# Patient Record
Sex: Male | Born: 2000 | Race: Asian | Hispanic: No | Marital: Single | Smoking: Never smoker
Health system: Southern US, Community
[De-identification: ages and names within clinical notes are randomized; demographics above are authoritative.]

## PROBLEM LIST (undated history)

## (undated) DIAGNOSIS — Q909 Down syndrome, unspecified: Secondary | ICD-10-CM

## (undated) HISTORY — DX: Down syndrome, unspecified: Q90.9

---
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
## 2018-01-28 ENCOUNTER — Ambulatory Visit (INDEPENDENT_AMBULATORY_CARE_PROVIDER_SITE_OTHER): Payer: Medicaid Other | Admitting: Family Medicine

## 2018-01-28 ENCOUNTER — Encounter: Payer: Self-pay | Admitting: Family Medicine

## 2018-01-28 ENCOUNTER — Other Ambulatory Visit: Payer: Self-pay

## 2018-01-28 VITALS — BP 95/60 | HR 83 | Temp 96.5°F | Wt 99.0 lb

## 2018-01-28 DIAGNOSIS — Q909 Down syndrome, unspecified: Secondary | ICD-10-CM

## 2018-01-28 DIAGNOSIS — L732 Hidradenitis suppurativa: Secondary | ICD-10-CM | POA: Diagnosis not present

## 2018-01-28 DIAGNOSIS — R5383 Other fatigue: Secondary | ICD-10-CM | POA: Diagnosis not present

## 2018-01-28 MED ORDER — DOXYCYCLINE HYCLATE 100 MG PO TABS
100.0000 mg | ORAL_TABLET | Freq: Two times a day (BID) | ORAL | 2 refills | Status: DC
Start: 2018-01-28 — End: 2020-02-09

## 2018-01-28 MED ORDER — OMEPRAZOLE 40 MG PO CPDR: 40.0000 mg | DELAYED_RELEASE_CAPSULE | Freq: Every day | ORAL | 3 refills | Status: DC

## 2018-01-28 MED ORDER — IBUPROFEN 600 MG PO TABS: 600.0000 mg | ORAL_TABLET | Freq: Three times a day (TID) | ORAL | 0 refills | Status: DC | PRN

## 2018-01-28 NOTE — Assessment & Plan Note (Addendum)
Active infections in inner thighs.  Patient father does not want to pursue I&D today.  Will trial doxycycline as this is work for the patient in the past.  Recommend warm compresses to encourage drainage.  Given limited capacity, recommend home health assessment for dressing changes and observation of wounds as needed.  Also plan to refer to dermatology for further management of disease, father interested in possible Biologics therapy. Patient to follow-up in 2 weeks to reassess, ibuprofen PRN for pain.  Strict return precautions given.

## 2018-01-28 NOTE — Assessment & Plan Note (Signed)
Patient not connected with community resources.  List of resources given.  Home health orders put in to help family with burden of care with son.  Also place referral to psychiatry for further management of ongoing symptoms and complications of Down syndrome.

## 2018-01-28 NOTE — Assessment & Plan Note (Addendum)
New onset fatigue.  Difficult to determine extent given patient's mental disability and nonverbal.  Given history of hidradenitis of vertebral with active inner thigh abscesses, concern for possible worsening systemic infection.  Vital signs are reassuring however.  Other causes could be uncontrolled pain, endocrine dysfunction, electrolyte abnormalities, anemia.  Will check for CMP, TSH, CBC to assess for this.  Given subjective improvement with Tums, will trial PPI if this is untreated GERD.  Patient follow-up in 2 weeks.

## 2018-01-28 NOTE — Progress Notes (Signed)
Subjective:  Brandon Bennett is a 17 y.o. male who presents to the [facility] today with a chief complaint of increase fatigue.   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 History provided by Father.   Fatigue For past two weeks, pt has seemed progressively less energetic, especially after meals, pt is also rubbing his stomach as if it is painful after eating. The only thing that seems to help is tums after meals. He has been eating and drinking normally. He has not had any constitutional symptoms that the father knows of including fevers, chills, weightloss. No respiratory sypmtoms or cold or cough. Pt has hydradinitis supertiva with recurrent abscesses. Father provides all of the wound care at home. Pt has had two draining and painful abscesses, one on each of his thighs.   Hydratinitis Supertivea Patient has history of recurrent inguinal abscesses.  Has been evaluated by dermatology and plastic surgery in the past [facility], not considered candidate for surgery given the extent of the disease and need for aggressive wound therapy in association with Ms. Down syndrome per father.    Down's Syndrome Primarily taken care of by father mother.  Patient is not connected with outside community resources or family support.  Or mental health officials.  Father is currently working out of state and is having difficulty managing the complexity of his son's health care.    Objective:  Physical Exam: BP 95/60   Pulse 83   Temp (!) 96.5 F (35.8 C) (Oral)   Wt 99 lb (44.9 kg)   SpO2 98%   BMI 19.33 kg/m   Gen: NAD, resting comfortably CV: RRR with no murmurs appreciated Pulm: NWOB, CTAB with no crackles, wheezes, or rhonchi GI: Normal bowel sounds present. Soft, Nontender, Nondistended. MSK: no edema, cyanosis, or clubbing noted Skin: multiple comeodone nodules on face and back, actively draining, small abscess on left inner thigh, another infected abcess on  right thigh which in not actively draining, they are tender to palpation, no streaking of skin or surround redness Neuro: nonverbal, obeys commands  No results found for this or any previous visit (from the past 72 hour(s)).   Assessment/Plan:  Fatigue New onset fatigue.  Difficult to determine extent given patient's mental disability and nonverbal.  Given history of hidradenitis of vertebral with active inner thigh abscesses, concern for possible worsening systemic infection.  Vital signs are reassuring however.  Other causes could be uncontrolled pain, endocrine dysfunction, electrolyte abnormalities, anemia.  Will check for CMP, TSH, CBC to assess for this.  Given subjective improvement with Tums, will trial PPI if this is untreated GERD.  Patient follow-up in 2 weeks.  Hidradenitis suppurativa Active infections in inner thighs.  Patient father does not want to pursue I&D today.  Will trial doxycycline as this is work for the patient in the past.  Recommend warm compresses to encourage drainage.  Given limited capacity, recommend home health assessment for dressing changes and observation of wounds as needed.  Also plan to refer to dermatology for further management of disease, father interested in possible Biologics therapy. Patient to follow-up in 2 weeks to reassess, ibuprofen PRN for pain.  Strict return precautions given.  DOWN SYNDROME Patient not connected with community resources.  List of resources given.  Home health orders put in to help family with burden of care with son.  Also place referral to psychiatry for further management of ongoing symptoms and complications of Down syndrome.  Lab Orders     TSH     Comprehensive metabolic panel     CBC with Differential  Meds ordered this encounter  Medications  . doxycycline (VIBRA-TABS) 100 MG tablet    Sig: Take 1 tablet (100 mg total) by mouth 2 (two) times daily.    Dispense:  20 tablet    Refill:  2  . omeprazole  (PRILOSEC) 40 MG capsule    Sig: Take 1 capsule (40 mg total) by mouth daily.    Dispense:  30 capsule    Refill:  3  . ibuprofen (ADVIL,MOTRIN) 600 MG tablet    Sig: Take 1 tablet (600 mg total) by mouth every 8 (eight) hours as needed.    Dispense:  30 tablet    Refill:  0    Thomes Dinning, MD, MS FAMILY MEDICINE RESIDENT - PGY1 01/28/2018 5:02 PM

## 2018-01-28 NOTE — Patient Instructions (Addendum)
It was a pleasure to see you today! Thank you for choosing [facility] for your primary care. Brandon Bennett was seen for fatigue.   1. For fatigue, we are getting lab work to rule out any infection or endocrine issues.   2. For his abscesses, we are starting him on doxycyline. Come back in two weeks, unless it is getting worse with fever, drainage. Continue to apply warm compresses. He can ibuprofen as needed for pain or discomfort. I am also placing an order for home health services.   3. For the Hydradenitis suppurativa, I am placing a referral to Dermatology. You will hear from our office on your follow up appointment.   4. For his Down Syndrome, we are providing you a list of community resources and I am referring you to Psychiatry for further support of his condition.   Best,  Thomes DinningBrad Thompson, MD, MS FAMILY MEDICINE RESIDENT - PGY1 01/28/2018 3:05 PM

## 2018-01-29 LAB — CBC WITH DIFFERENTIAL/PLATELET
BASOS ABS: 0.1 10*3/uL (ref 0.0–0.2)
Basos: 3 %
EOS (ABSOLUTE): 0.1 10*3/uL (ref 0.0–0.4)
EOS: 2 %
Hematocrit: 43.1 % (ref 37.5–51.0)
Hemoglobin: 14.4 g/dL (ref 13.0–17.7)
IMMATURE GRANULOCYTES: 0 %
Immature Grans (Abs): 0 10*3/uL (ref 0.0–0.1)
LYMPHS ABS: 1.6 10*3/uL (ref 0.7–3.1)
Lymphs: 40 %
MCH: 34.6 pg — ABNORMAL HIGH (ref 26.6–33.0)
MCHC: 33.4 g/dL (ref 31.5–35.7)
MCV: 104 fL — ABNORMAL HIGH (ref 79–97)
MONOCYTES: 8 %
MONOS ABS: 0.3 10*3/uL (ref 0.1–0.9)
NEUTROS PCT: 47 %
Neutrophils Absolute: 1.8 10*3/uL (ref 1.4–7.0)
PLATELETS: 109 10*3/uL — AB (ref 150–450)
RBC: 4.16 x10E6/uL (ref 4.14–5.80)
RDW: 11.1 % — AB (ref 12.3–15.4)
WBC: 3.9 10*3/uL (ref 3.4–10.8)

## 2018-01-29 LAB — TSH: TSH: 1.24 u[IU]/mL (ref 0.450–4.500)

## 2018-01-29 LAB — COMPREHENSIVE METABOLIC PANEL
ALBUMIN: 4 g/dL (ref 3.5–5.5)
ALT: 38 IU/L (ref 0–44)
AST: 32 IU/L (ref 0–40)
Albumin/Globulin Ratio: 1.1 — ABNORMAL LOW (ref 1.2–2.2)
Alkaline Phosphatase: 113 IU/L (ref 39–117)
BUN / CREAT RATIO: 14 (ref 9–20)
BUN: 11 mg/dL (ref 6–20)
Bilirubin Total: 0.7 mg/dL (ref 0.0–1.2)
CALCIUM: 9 mg/dL (ref 8.7–10.2)
CHLORIDE: 101 mmol/L (ref 96–106)
CO2: 27 mmol/L (ref 20–29)
CREATININE: 0.76 mg/dL (ref 0.76–1.27)
GFR calc non Af Amer: 132 mL/min/{1.73_m2} (ref >59)
GFR, EST AFRICAN AMERICAN: 153 mL/min/{1.73_m2} (ref >59)
GLUCOSE: 79 mg/dL (ref 65–99)
Globulin, Total: 3.5 g/dL (ref 1.5–4.5)
Potassium: 4.3 mmol/L (ref 3.5–5.2)
Sodium: 139 mmol/L (ref 134–144)
TOTAL PROTEIN: 7.5 g/dL (ref 6.0–8.5)

## 2018-02-01 ENCOUNTER — Telehealth: Payer: Self-pay | Admitting: Family Medicine

## 2018-02-01 ENCOUNTER — Encounter: Payer: Self-pay | Admitting: Family Medicine

## 2018-02-01 MED ORDER — OMEPRAZOLE 20 MG PO CPDR: 20.0000 mg | DELAYED_RELEASE_CAPSULE | Freq: Two times a day (BID) | ORAL | 3 refills | Status: DC | PRN

## 2018-02-01 NOTE — Progress Notes (Signed)
Please send to pt . Thank you

## 2018-02-01 NOTE — Telephone Encounter (Signed)
Pts father called nurse line asking about spilling the omeprazole dose. Pts father states he thinks its too high for him and would like to give half in am and half pm. Please call him at your earliest convience. Pts father seems quite worried about this.

## 2018-02-01 NOTE — Telephone Encounter (Signed)
Father has a question about omeprazole.  He wonders if he could give him a half a pill in the morning and one at night. Please advise

## 2018-02-01 NOTE — Telephone Encounter (Signed)
Changed dosage to 20 mg bid. Pls call pt and inform.

## 2018-02-02 NOTE — Telephone Encounter (Signed)
Pts father contacted and informed of dosage change. He was very appreciative and had no further questions.

## 2018-02-16 ENCOUNTER — Telehealth: Payer: Self-pay | Admitting: Family Medicine

## 2018-02-16 NOTE — Telephone Encounter (Signed)
Called pt father to follow up on skin infection and referrals placed during last apt. No answer left VM to call clinic back regarding these topics.

## 2018-02-17 NOTE — Telephone Encounter (Signed)
Will forward to MD. Jazmin Hartsell,CMA  

## 2018-02-17 NOTE — Telephone Encounter (Signed)
Pt father was calling  to return Dr. Carollee Massedhompson's phone call from yesterday. He would like Dr. Janee Mornhompson to call him back.

## 2018-02-18 ENCOUNTER — Telehealth: Payer: Self-pay | Admitting: Family Medicine

## 2018-02-18 NOTE — Telephone Encounter (Signed)
Called pt yesterday evening. Father answered and said that son was doing better. He has a schedule apt with dermatology in a few weeks and is trying to get connected with other services. Plans to have pt return in a few weeks to follow up for general health.

## 2019-07-16 ENCOUNTER — Other Ambulatory Visit: Payer: Self-pay

## 2019-07-16 ENCOUNTER — Emergency Department (HOSPITAL_COMMUNITY)
Admission: EM | Admit: 2019-07-16 | Discharge: 2019-07-17 | Disposition: A | Discharge status: Home / Self Care | Payer: No Typology Code available for payment source | Attending: Emergency Medicine | Admitting: Emergency Medicine

## 2019-07-16 ENCOUNTER — Emergency Department (HOSPITAL_COMMUNITY): Payer: No Typology Code available for payment source

## 2019-07-16 DIAGNOSIS — Q909 Down syndrome, unspecified: Secondary | ICD-10-CM | POA: Diagnosis not present

## 2019-07-16 DIAGNOSIS — R4182 Altered mental status, unspecified: Secondary | ICD-10-CM | POA: Diagnosis present

## 2019-07-16 DIAGNOSIS — U071 COVID-19: Secondary | ICD-10-CM | POA: Diagnosis not present

## 2019-07-16 DIAGNOSIS — Z79899 Other long term (current) drug therapy: Secondary | ICD-10-CM | POA: Insufficient documentation

## 2019-07-16 LAB — CBC WITH DIFFERENTIAL/PLATELET
Abs Immature Granulocytes: 0.01 10*3/uL (ref 0.00–0.07)
Basophils Absolute: 0 10*3/uL (ref 0.0–0.1)
Basophils Relative: 0 %
Eosinophils Absolute: 0 10*3/uL (ref 0.0–0.5)
Eosinophils Relative: 0 %
HCT: 41.6 % (ref 39.0–52.0)
Hemoglobin: 14 g/dL (ref 13.0–17.0)
Immature Granulocytes: 0 %
Lymphocytes Relative: 65 %
Lymphs Abs: 2.1 10*3/uL (ref 0.7–4.0)
MCH: 35.1 pg — ABNORMAL HIGH (ref 26.0–34.0)
MCHC: 33.7 g/dL (ref 30.0–36.0)
MCV: 104.3 fL — ABNORMAL HIGH (ref 80.0–100.0)
Monocytes Absolute: 0.2 10*3/uL (ref 0.1–1.0)
Monocytes Relative: 5 %
Neutro Abs: 1 10*3/uL — ABNORMAL LOW (ref 1.7–7.7)
Neutrophils Relative %: 30 %
Platelets: 81 10*3/uL — ABNORMAL LOW (ref 150–400)
RBC: 3.99 MIL/uL — ABNORMAL LOW (ref 4.22–5.81)
RDW: 11.5 % (ref 11.5–15.5)
WBC: 3.2 10*3/uL — ABNORMAL LOW (ref 4.0–10.5)
nRBC: 0.6 % — ABNORMAL HIGH (ref 0.0–0.2)

## 2019-07-16 LAB — COMPREHENSIVE METABOLIC PANEL
ALT: 73 U/L — ABNORMAL HIGH (ref 0–44)
AST: 105 U/L — ABNORMAL HIGH (ref 15–41)
Albumin: 3 g/dL — ABNORMAL LOW (ref 3.5–5.0)
Alkaline Phosphatase: 95 U/L (ref 38–126)
Anion gap: 9 (ref 5–15)
BUN: 9 mg/dL (ref 6–20)
CO2: 26 mmol/L (ref 22–32)
Calcium: 7.8 mg/dL — ABNORMAL LOW (ref 8.9–10.3)
Chloride: 104 mmol/L (ref 98–111)
Creatinine, Ser: 0.8 mg/dL (ref 0.61–1.24)
GFR calc Af Amer: >60 mL/min (ref >60)
GFR calc non Af Amer: >60 mL/min (ref >60)
Glucose, Bld: 87 mg/dL (ref 70–99)
Potassium: 4.4 mmol/L (ref 3.5–5.1)
Sodium: 139 mmol/L (ref 135–145)
Total Bilirubin: 0.6 mg/dL (ref 0.3–1.2)
Total Protein: 6.9 g/dL (ref 6.5–8.1)

## 2019-07-16 LAB — LACTIC ACID, PLASMA: Lactic Acid, Venous: 1.5 mmol/L (ref 0.5–1.9)

## 2019-07-16 LAB — POC SARS CORONAVIRUS 2 AG -  ED: SARS Coronavirus 2 Ag: POSITIVE — AB

## 2019-07-16 LAB — LIPASE, BLOOD: Lipase: 31 U/L (ref 11–51)

## 2019-07-16 MED ORDER — SODIUM CHLORIDE 0.9 % IV BOLUS
1000.0000 mL | Freq: Once | INTRAVENOUS | Status: AC
  Administered 2019-07-16: 1000 mL via INTRAVENOUS

## 2019-07-16 MED ORDER — SODIUM CHLORIDE 0.9 % IV BOLUS
500.0000 mL | Freq: Once | INTRAVENOUS | Status: AC
  Administered 2019-07-16: 500 mL via INTRAVENOUS

## 2019-07-16 NOTE — ED Triage Notes (Signed)
Received patient via [facility], reportedly more altered per family. Has hx of down syndrome and baseline is communicative to family but right now slightly lethargic. Reportedly had an episode of syncope at home, per hypotensive en route to the hospital. Family at bedside.

## 2019-07-16 NOTE — ED Provider Notes (Signed)
[facility] EMERGENCY DEPARTMENT Provider Note   CSN: [id number] Arrival date & time: 07/16/19  1909     History Chief Complaint  Patient presents with  . Altered Mental Status    Brandon Bennett is a 19 y.o. male with history of Down syndrome and hidradenitis suppurativa.  Patient is here with his father who is primary caretaker.  Patient nonverbal at baseline but is able to express needs through body language.  Normally interactive with family, eats, helps bathe himself and ambulates around the house.  Father reports that over the last 2-3 days patient has seemed more tired, not wanting to eat and drink or perform normal activities.  Father reports that 2 days ago he was walking patient to the bathroom when the patient had a brief syncopal episode, no injury at that time.  Has not reoccurred.  Father reports that since onset of symptoms 3 days ago patient has had multiple episodes of diarrhea and single episode of nonbloody/nonbilious emesis.  Father reports they have measured fevers intermittently at home.  He is concerned patient may be dehydrated.   HPI     Past Medical History:  Diagnosis Date  . Down syndrome     Patient Active Problem List   Diagnosis Date Noted  . Fatigue 01/28/2018  . Infective otitis externa of both ears 10/30/2017  . Health care maintenance 05/12/2017  . Recurrent boils 05/29/2014  . Hidradenitis suppurativa 06/16/2013  . DOWN SYNDROME 09/08/2007    No past surgical history on file.     Family History  Problem Relation Age of Onset  . Healthy Mother     Social History   Tobacco Use  . Smoking status: Never Smoker  . Smokeless tobacco: Never Used  Substance Use Topics  . Alcohol use: No  . Drug use: No    Home Medications Prior to Admission medications   Medication Sig Start Date End Date Taking? Authorizing Provider  Cetirizine HCl (CETIRIZINE HCL ALLERGY CHILD) 5 MG/5ML SYRP Take 5 mg by mouth daily. Please give  him what Medicaid will pay for OTC is fine.  Should not need PA.  Disp 1 month supply.     [provider]  ciprofloxacin-dexamethasone (CIPRODEX) OTIC suspension Place 4 drops into both ears 2 (two) times daily. 10/28/17   Mikell, Antionette Poles, MD  clindamycin (CLINDAGEL) 1 % gel Apply topically 2 (two) times daily. 03/09/17   Diallo, Lilia Argue, MD  clindamycin-benzoyl peroxide (BENZACLIN WITH PUMP) gel Apply topically 2 (two) times daily. 05/07/17   Beaulah Dinning, MD  doxycycline (VIBRA-TABS) 100 MG tablet Take 1 tablet (100 mg total) by mouth 2 (two) times daily. 01/28/18   Garnette Gunner, MD  fluticasone (FLONASE) 50 MCG/ACT nasal spray Place 1 spray into both nostrils daily. 05/07/17   Beaulah Dinning, MD  ibuprofen (ADVIL,MOTRIN) 600 MG tablet Take 1 tablet (600 mg total) by mouth every 8 (eight) hours as needed. 01/28/18   Garnette Gunner, MD  omeprazole (PRILOSEC) 20 MG capsule Take 1 capsule (20 mg total) by mouth 2 (two) times daily as needed. 02/01/18   Garnette Gunner, MD  traMADol (ULTRAM) 50 MG tablet Take 1 tablet (50 mg total) by mouth every 8 (eight) hours as needed. 07/10/16   Beaulah Dinning, MD    Allergies    Patient has no known allergies.  Review of Systems   Review of Systems  Unable to perform ROS: Patient nonverbal    Physical Exam Updated  Vital Signs BP 100/71   Pulse 63   Temp 98.5 F (36.9 C) (Oral)   Resp 18   Ht 5\' 5"  (1.651 m)   Wt 45.4 kg   SpO2 100%   BMI 16.64 kg/m   Physical Exam Constitutional:      General: He is not in acute distress.    Appearance: Normal appearance. He is well-developed. He is not ill-appearing or diaphoretic.  HENT:     Head: Normocephalic and atraumatic.     Jaw: There is normal jaw occlusion.     Right Ear: External ear normal.     Left Ear: External ear normal.     Nose: Nose normal.     Mouth/Throat:     Mouth: Mucous membranes are moist.     Pharynx: Oropharynx is clear. Uvula  midline.  Eyes:     General: Vision grossly intact. Gaze aligned appropriately.     Pupils: Pupils are equal, round, and reactive to light.  Neck:     Trachea: Trachea and phonation normal. No tracheal tenderness or tracheal deviation.  Cardiovascular:     Rate and Rhythm: Normal rate and regular rhythm.     Pulses: Normal pulses.     Heart sounds: Normal heart sounds.  Pulmonary:     Effort: Pulmonary effort is normal. No respiratory distress.     Breath sounds: Normal breath sounds.  Abdominal:     General: There is no distension.     Palpations: Abdomen is soft.     Tenderness: There is no abdominal tenderness. There is no guarding or rebound.  Musculoskeletal:        General: Normal range of motion.     Cervical back: Full passive range of motion without pain, normal range of motion and neck supple.     Right lower leg: No edema.     Left lower leg: No edema.  Lymphadenopathy:     Cervical: No cervical adenopathy.  Skin:    General: Skin is warm and dry.  Neurological:     Mental Status: He is alert.     GCS: GCS eye subscore is 4. GCS verbal subscore is 5. GCS motor subscore is 6.     Comments: Nonverbal, baseline per father, smiling No cranial nerve deficits Moves bilateral extremities without evidence of pain and with equal strength  Psychiatric:        Behavior: Behavior normal.     ED Results / Procedures / Treatments   Labs (all labs ordered are listed, but only abnormal results are displayed) Labs Reviewed  CBC WITH DIFFERENTIAL/PLATELET - Abnormal; Notable for the following components:      Result Value   WBC 3.2 (*)    RBC 3.99 (*)    MCV 104.3 (*)    MCH 35.1 (*)    Platelets 81 (*)    nRBC 0.6 (*)    Neutro Abs 1.0 (*)    All other components within normal limits  COMPREHENSIVE METABOLIC PANEL - Abnormal; Notable for the following components:   Calcium 7.8 (*)    Albumin 3.0 (*)    AST 105 (*)    ALT 73 (*)    All other components within normal  limits  URINALYSIS, ROUTINE W REFLEX MICROSCOPIC - Abnormal; Notable for the following components:   Color, Urine STRAW (*)    Hgb urine dipstick SMALL (*)    All other components within normal limits  POC SARS CORONAVIRUS 2 AG -  ED -  Abnormal; Notable for the following components:   SARS Coronavirus 2 Ag POSITIVE (*)    All other components within normal limits  CULTURE, BLOOD (ROUTINE X 2)  CULTURE, BLOOD (ROUTINE X 2)  LIPASE, BLOOD  LACTIC ACID, PLASMA  LACTIC ACID, PLASMA    EKG EKG Interpretation  Date/Time:  Sunday July 16 2019 19:58:59 EST Ventricular Rate:  69 PR Interval:    QRS Duration: 82 QT Interval:  403 QTC Calculation: 432 R Axis:   80 Text Interpretation: Sinus rhythm Short PR interval When compared with ECG of 21-Feb-1999, QRS axis is now normal Nonspecific T wave abnormality is no longer present HEART RATE has decreased Confirmed by Dione Booze ([id number]) on 07/17/2019 12:09:16 AM   Radiology DG Chest Portable 1 View  Result Date: 07/16/2019 CLINICAL DATA:  Fever, hypotension EXAM: PORTABLE CHEST 1 VIEW COMPARISON:  06/13/2016 FINDINGS: The heart size and mediastinal contours are within normal limits. Both lungs are clear. The visualized skeletal structures are unremarkable. IMPRESSION: No acute abnormality of the lungs in AP portable projection. Electronically Signed   By: Lauralyn Primes M.D.   On: 07/16/2019 20:13    Procedures Procedures (including critical care time)  Medications Ordered in ED Medications  sodium chloride 0.9 % bolus 1,000 mL (0 mLs Intravenous Stopped 07/16/19 2140)  sodium chloride 0.9 % bolus 500 mL (0 mLs Intravenous Stopped 07/16/19 2334)  sodium chloride 0.9 % bolus 1,000 mL (1,000 mLs Intravenous New Bag/Given 07/16/19 2343)    ED Course  I have reviewed the triage vital signs and the nursing notes.  Pertinent labs & imaging results that were available during my care of the patient were reviewed by me and considered in my medical  decision making (see chart for details).    MDM Rules/Calculators/A&P                     Covid positive Lipase within normal limits CBC with leukopenia 29.1-year-old, platelets 81, consistent with Covid CMP with AST 105, ALT 73, suspect secondary to Covid, no abdominal pain and family reports he has not vomited since yesterday which was only small amount, additionally no history of Tylenol abuse.  Doubt cholecystitis, choledocholithiasis or other emergent intra-abdominal etiologies at this time. Lactic 1.5 Chest x-ray:  IMPRESSION:  No acute abnormality of the lungs in AP portable projection.  Urinalysis with small hemoglobin, straw-colored otherwise within normal limits Blood cultures pending EKG: Sinus rhythm Short PR interval When compared with ECG of August 09, 1998, QRS axis is now normal Nonspecific T wave abnormality is no longer present HEART RATE has decreased Confirmed by Dione Booze ([id number]) on 07/17/2019 12:09:16 AM - Patient reassessed multiple times each time he is comfortable, well-appearing, smiling and interacting with family members.  Blood pressure has remained soft, low 100 systolic.  Chart reviewed, it appears this is patient's baseline blood pressure.  He is a thin patient.  He is mentating at baseline per family.  I had discussion with patient's family member Brandon Bennett who advises they are comfortable with caring for patient at home.  He has had no tachycardia or hypoxia throughout this visit.  Lab abnormalities are indicative of his Covid infection, there is no indication for admission or further work-up here in the emergency department.  I have advised family to call patient's primary care provider tomorrow morning to inform them of his Covid positive status and to return to the ER for any new or worsening symptoms.  At this time there does not appear  to be any evidence of an acute emergency medical condition and the patient appears stable for discharge with appropriate outpatient  follow up. Diagnosis was discussed with family who verbalizes understanding of care plan and is agreeable to discharge. I have discussed return precautions with patient and family who verbalizes understanding of return precautions. Family encouraged to follow-up with their PCP. All questions answered. Patient has been discharged in good condition.  Patient's case discussed with Dr. Johnney Killian who agrees with plan to discharge with follow-up.   Brandon Bennett was evaluated in Emergency Department on 07/17/2019 for the symptoms described in the history of present illness. He was evaluated in the context of the global COVID-19 pandemic, which necessitated consideration that the patient might be at risk for infection with the SARS-CoV-2 virus that causes COVID-19. Institutional protocols and algorithms that pertain to the evaluation of patients at risk for COVID-19 are in a state of rapid change based on information released by regulatory bodies including the CDC and federal and state organizations. These policies and algorithms were followed during the patient's care in the ED.   Note: Portions of this report may have been transcribed using voice recognition software. Every effort was made to ensure accuracy; however, inadvertent computerized transcription errors may still be present.  Final Clinical Impression(s) / ED Diagnoses Final diagnoses:  COVID-19 virus infection    Rx / DC Orders ED Discharge Orders    None       Gari Crown 07/17/19 0032    Charlesetta Shanks, MD 07/24/19 1122

## 2019-07-17 LAB — URINALYSIS, ROUTINE W REFLEX MICROSCOPIC
Bacteria, UA: NONE SEEN
Bilirubin Urine: NEGATIVE
Glucose, UA: NEGATIVE mg/dL
Ketones, ur: NEGATIVE mg/dL
Leukocytes,Ua: NEGATIVE
Nitrite: NEGATIVE
Protein, ur: NEGATIVE mg/dL
Specific Gravity, Urine: 1.005 (ref 1.005–1.030)
pH: 7 (ref 5.0–8.0)

## 2019-07-17 NOTE — Discharge Instructions (Addendum)
You have been diagnosed today with COVID-19 infection.  At this time there does not appear to be the presence of an emergent medical condition, however there is always the potential for conditions to change. Please read and follow the below instructions.  Please return to the Emergency Department immediately for any new or worsening symptoms. Please be sure to follow up with your Primary Care Provider tomorrow morning to inform them of your positive Covid test and to schedule a follow-up appointment. Please drink plenty of water and get plenty of rest.  All of your lab and imaging results are available on your MyChart account.  Please discuss all results with your primary care provider at your follow-up appointment.  Get help right away if: You have trouble breathing. You have pain or pressure in your chest. You have confusion. You have bluish lips and fingernails. You have difficulty waking from sleep. You have any new/concerning or worsening of symptoms.  Please read the additional information packets attached to your discharge summary.  Do not take your medicine if  develop an itchy rash, swelling in your mouth or lips, or difficulty breathing; call 911 and seek immediate emergency medical attention if this occurs.  Note: Portions of this text may have been transcribed using voice recognition software. Every effort was made to ensure accuracy; however, inadvertent computerized transcription errors may still be present.

## 2019-07-21 LAB — CULTURE, BLOOD (ROUTINE X 2)
Culture: NO GROWTH
Culture: NO GROWTH
Special Requests: ADEQUATE
Special Requests: ADEQUATE

## 2020-02-09 ENCOUNTER — Encounter: Payer: Self-pay | Admitting: Family Medicine

## 2020-02-09 ENCOUNTER — Other Ambulatory Visit: Payer: Self-pay

## 2020-02-09 ENCOUNTER — Ambulatory Visit (INDEPENDENT_AMBULATORY_CARE_PROVIDER_SITE_OTHER): Payer: No Typology Code available for payment source | Admitting: Family Medicine

## 2020-02-09 DIAGNOSIS — L732 Hidradenitis suppurativa: Secondary | ICD-10-CM

## 2020-02-09 DIAGNOSIS — R12 Heartburn: Secondary | ICD-10-CM | POA: Diagnosis not present

## 2020-02-09 MED ORDER — DOXYCYCLINE HYCLATE 100 MG PO TABS: 100.0000 mg | ORAL_TABLET | Freq: Two times a day (BID) | ORAL | 2 refills | Status: DC

## 2020-02-09 MED ORDER — FAMOTIDINE 20 MG PO TABS: 20.0000 mg | ORAL_TABLET | Freq: Two times a day (BID) | ORAL | 1 refills | Status: DC

## 2020-02-09 NOTE — Patient Instructions (Addendum)
It was wonderful to meet you today!  I am sorry you are having these issues with possible heartburn as well as with the hidradenitis.  For the heartburn I am going to prescribe Pepcid for him twice daily for 6 weeks.  Please let me know if this is not helping.  Regarding the hidradenitis I am going to refill the prescription for doxycycline.  Please take this twice daily for 10 days.  I have sent 2 refills and for him to take but use this with discretion, we do not want him taking antibiotics all the time.  If you have any questions or concerns please feel free to call our clinic.  I hope you have a wonderful afternoon!

## 2020-02-09 NOTE — Progress Notes (Signed)
    SUBJECTIVE:   CHIEF COMPLAINT / HPI:   Heartburn concern  Patient has history of Down syndrome and is nonverbal.  Patient's father reports that the patient has been rubbing his chest recently especially after meals.  The discomfort that the patient appears to be having resolves spontaneously but the father is concerned he may have heartburn.  Hydradenitis  Patient with longstanding history of this in his groin.  His been seen and treated on multiple occasions and has been evaluated by dermatology who the father reports says that there is nothing they can do for this.  He has been doing well over the last 3 to 6 months but is reportedly having an outbreak at this time.  Patient has received intermittent doses of doxycycline for this but they are out of the refills for the doxycycline.  Denies any fever.  Reports 3 major abscesses that they are concerned about.   OBJECTIVE:   BP (!) 94/50   Pulse 52   Wt (!) 94 lb 6.4 oz (42.8 kg)   SpO2 96%   BMI 15.71 kg/m   General:  ASSESSMENT/PLAN:   Heart burn Patient is nonverbal and has difficulty expressing issues.  Patient's father and family is concerned that he is having episodes of heartburn.  Denies him having his chest especially after eating large meals and when lying down.  Have not tried any over-the-counter medications at this time.  Recommended Pepcid. -Pepcid 20 mg prescribed to patient -Continue to monitor and follow-up if needed  Hidradenitis suppurativa Patient has active boils on inner thighs as well as on buttocks.  No need for incision and drainage at this time.  Patient's father reports previous prescriptions for doxycycline have worked very well and that he is out of the prescription refills at this time.  Discussed keeping clean and dressing changes.  We discussed referral for dermatology but they have seen dermatology in the past and report that there was little to be done. -Prescription for doxycycline  given -Ibuprofen or Tylenol for pain -Strict return precautions given -Patient is to schedule a wellness visit so can be reevaluated at this time     Derrel Nip, MD [facility]

## 2020-02-12 DIAGNOSIS — R12 Heartburn: Secondary | ICD-10-CM | POA: Insufficient documentation

## 2020-02-12 NOTE — Assessment & Plan Note (Signed)
Patient is nonverbal and has difficulty expressing issues.  Patient's father and family is concerned that he is having episodes of heartburn.  Denies him having his chest especially after eating large meals and when lying down.  Have not tried any over-the-counter medications at this time.  Recommended Pepcid. -Pepcid 20 mg prescribed to patient -Continue to monitor and follow-up if needed

## 2020-02-12 NOTE — Assessment & Plan Note (Signed)
Patient has active boils on inner thighs as well as on buttocks.  No need for incision and drainage at this time.  Patient's father reports previous prescriptions for doxycycline have worked very well and that he is out of the prescription refills at this time.  Discussed keeping clean and dressing changes.  We discussed referral for dermatology but they have seen dermatology in the past and report that there was little to be done. -Prescription for doxycycline given -Ibuprofen or Tylenol for pain -Strict return precautions given -Patient is to schedule a wellness visit so can be reevaluated at this time

## 2020-05-03 ENCOUNTER — Telehealth: Payer: Self-pay

## 2020-06-05 ENCOUNTER — Other Ambulatory Visit: Payer: Self-pay

## 2020-06-05 ENCOUNTER — Encounter: Payer: Self-pay | Admitting: Family Medicine

## 2020-06-05 ENCOUNTER — Ambulatory Visit (INDEPENDENT_AMBULATORY_CARE_PROVIDER_SITE_OTHER): Payer: No Typology Code available for payment source | Admitting: Family Medicine

## 2020-06-05 VITALS — BP 95/60 | HR 80 | Ht 60.0 in | Wt 95.2 lb

## 2020-06-05 DIAGNOSIS — G4700 Insomnia, unspecified: Secondary | ICD-10-CM

## 2020-06-05 DIAGNOSIS — L732 Hidradenitis suppurativa: Secondary | ICD-10-CM

## 2020-06-05 DIAGNOSIS — R12 Heartburn: Secondary | ICD-10-CM

## 2020-06-05 DIAGNOSIS — Z23 Encounter for immunization: Secondary | ICD-10-CM

## 2020-06-05 DIAGNOSIS — R748 Abnormal levels of other serum enzymes: Secondary | ICD-10-CM

## 2020-06-05 DIAGNOSIS — F99 Mental disorder, not otherwise specified: Secondary | ICD-10-CM | POA: Insufficient documentation

## 2020-06-05 MED ORDER — TRAZODONE HCL 50 MG PO TABS: 25.0000 mg | ORAL_TABLET | Freq: Every evening | ORAL | 1 refills | Status: DC | PRN

## 2020-06-05 NOTE — Assessment & Plan Note (Signed)
Apparent by his behavior.  Continue as needed H2 blocker.  Does not seem to have any complications

## 2020-06-05 NOTE — Patient Instructions (Signed)
Good to see you today!  Thanks for coming in.  I will call you if your tests are not good.  Otherwise, I will send you a message on MyChart (if it is active) or a letter in the mail..  If you do not hear from me with in 2 weeks please call our office.    Try the trazadone 1/2 tab at nights when he has trouble sleeping Try not to use every night  Try the otc medicine to help children stop sucking their finger Use vaseline on the rough area  Make an appointment with his PCP Dr Wynelle Link to see how the sleep and behavior is doing

## 2020-06-05 NOTE — Assessment & Plan Note (Signed)
Patients relatively new insomnia very disruptive to family.  Has failed otc medications and routine changes.  Will try trazadone starting with low dose to see if improves

## 2020-06-05 NOTE — Assessment & Plan Note (Signed)
Chronic fairly well controlled.  Continue antibacterial soap and intermittent doxycycline

## 2020-06-05 NOTE — Assessment & Plan Note (Signed)
When last checked in ER in January when he had covid.  Will repeat

## 2020-06-05 NOTE — Progress Notes (Signed)
    SUBJECTIVE:   CHIEF COMPLAINT / HPI:   Brought in by father of physical and several concerns.  Patient is nonverbal and does not follow requests except by parents  HIDRADENTIS - Has had for years, intermittently develops boils.  These respond to oral doxycycline almost always.  Uses antibacterial soap.  No active one now.  Are concerned is getting more comedomes on his back  INSOMNIA For a number of months has been sleeping much less. Harder to get to go to bed and to go to sleep.  Have tried otc antihistamines and melatonin but do not help  BEHAVIORS Also over a number of months has been laughing out loud for no particular reason and for prolonged inappropriate periods of time such as bed time or when out a gatherings Have also noted increased inappropriate masturbation.  These are new behaviors and he had not had any evaluation or treatment Otherwise seems to be acting normally.  Eating and parental interactions are unchanged.  HEART BURN Intermittently seems to have pain in his chest that has been determined to be reflux.  Takes Pepcid as needed.  No vomiting or bleeding   PERTINENT  PMH / PSH: was referred to psychiatry in 2019   OBJECTIVE:   BP 95/60   Pulse 80   Ht 5' (1.524 m)   Wt 95 lb 3.2 oz (43.2 kg)   SpO2 99%   BMI 18.59 kg/m   Sits contentedly Resists listening to heart and lungs unless encouraged by father Intermittent soft grunting sounds.  No laughter noted Heart - Regular rate and rhythm.  No murmurs, gallops or rubs.    Lungs:  Normal respiratory effort, chest expands symmetrically. Lungs are clear to auscultation, no crackles or wheezes. Abdomen: soft and non-tender without masses, organomegaly or hernias noted.  No guarding or rebound Groin - numerous superficial scars and healed lesions primarily between legs.  No active abscesses  Neck:  No deformities, thyromegaly, masses, or tenderness noted.   Supple with full range of motion without  pain.   ASSESSMENT/PLAN:   Insomnia Patients relatively new insomnia very disruptive to family.  Has failed otc medications and routine changes.  Will try trazadone starting with low dose to see if improves  Inappropriate behavior Including prolonged distracting laughter and public masturbation.   Will see how responds to trazadone for sleep.  These behaviors are likely related to aging.   May need referral   Hidradenitis suppurativa Chronic fairly well controlled.  Continue antibacterial soap and intermittent doxycycline  Heart burn Apparent by his behavior.  Continue as needed H2 blocker.  Does not seem to have any complications   Elevated liver enzymes When last checked in ER in January when he had covid.  Will repeat      Carney Living, MD [facility]

## 2020-06-05 NOTE — Assessment & Plan Note (Signed)
Including prolonged distracting laughter and public masturbation.   Will see how responds to trazadone for sleep.  These behaviors are likely related to aging.   May need referral

## 2020-06-06 LAB — CMP14+EGFR
ALT: 31 IU/L (ref 0–44)
AST: 27 IU/L (ref 0–40)
Albumin/Globulin Ratio: 1.3 (ref 1.2–2.2)
Albumin: 4 g/dL — ABNORMAL LOW (ref 4.1–5.2)
Alkaline Phosphatase: 115 IU/L (ref 44–121)
BUN/Creatinine Ratio: 15 (ref 9–20)
BUN: 12 mg/dL (ref 6–20)
Bilirubin Total: 0.6 mg/dL (ref 0.0–1.2)
CO2: 27 mmol/L (ref 20–29)
Calcium: 8.7 mg/dL (ref 8.7–10.2)
Chloride: 102 mmol/L (ref 96–106)
Creatinine, Ser: 0.78 mg/dL (ref 0.76–1.27)
GFR calc Af Amer: 149 mL/min/{1.73_m2} (ref >59)
GFR calc non Af Amer: 129 mL/min/{1.73_m2} (ref >59)
Globulin, Total: 3.1 g/dL (ref 1.5–4.5)
Glucose: 66 mg/dL (ref 65–99)
Potassium: 4.1 mmol/L (ref 3.5–5.2)
Sodium: 140 mmol/L (ref 134–144)
Total Protein: 7.1 g/dL (ref 6.0–8.5)

## 2020-06-10 ENCOUNTER — Encounter: Payer: Self-pay | Admitting: Family Medicine

## 2020-07-02 NOTE — Telephone Encounter (Signed)
Opened in error

## 2020-11-19 ENCOUNTER — Ambulatory Visit (INDEPENDENT_AMBULATORY_CARE_PROVIDER_SITE_OTHER): Payer: No Typology Code available for payment source | Admitting: Family Medicine

## 2020-11-19 ENCOUNTER — Other Ambulatory Visit: Payer: Self-pay

## 2020-11-19 VITALS — BP 98/60 | HR 85 | Wt 95.4 lb

## 2020-11-19 DIAGNOSIS — H9211 Otorrhea, right ear: Secondary | ICD-10-CM | POA: Diagnosis not present

## 2020-11-19 DIAGNOSIS — R350 Frequency of micturition: Secondary | ICD-10-CM | POA: Diagnosis not present

## 2020-11-19 DIAGNOSIS — L722 Steatocystoma multiplex: Secondary | ICD-10-CM

## 2020-11-19 LAB — POCT GLYCOSYLATED HEMOGLOBIN (HGB A1C): Hemoglobin A1C: 4.6 % (ref 4.0–5.6)

## 2020-11-19 MED ORDER — DEBROX 6.5 % OT SOLN: 5.0000 [drp] | Freq: Two times a day (BID) | OTIC | 0 refills | Status: DC

## 2020-11-19 NOTE — Progress Notes (Signed)
SUBJECTIVE:   CHIEF COMPLAINT / HPI:   Patient is nonverbal w/ down syndrome and was accompanied by sister. Father (legal guardian) was called and confirmed with myself and CMA that sister present was able to speak for the patient. Form to be signed and notarized for subsequent visits was sent home with patient's sister.   Brandon Bennett is a 20 yo M who presents to [facility] for the following issues.   Ear drainage 1 week of brown/yellow/green drainage from right ear. It is watery and gooey. No fever or recent illness. Sister is not aware of any objects being put in the ears. He does not appear to be bothered by the drainage or pulling at his right ear.   Pimples on back and groin Hx of pimples in these areas. They will sometimes go and come. Appear to be itchy to the patient. There is not drainage from them. Sister states they have not used any creams or special washes for them. No changes in hygiene products or detergents. No new foods. No known insect bites.   Increase toileting accidents Wears adult pull up and occasionally has urine and feces accidents but appears to have increased. Sister is worried about possible diabetes and would like to check an A1c. There is an associated life change. The family is residing between [place]. Father works in [employer] development [employer] and patient goes back and forth with the family. Sister is mostly with his but has not been present as much due to school. Older sister recently got married and moved out of home with the family.    PERTINENT  PMH / PSH: Insomnia  OBJECTIVE:   BP 98/60   Pulse 85   Wt 95 lb 6.4 oz (43.3 kg)   SpO2 96%   BMI 18.63 kg/m   General: Appears well, no acute distress. Age appropriate. HEENT: Right ear with brown wax. Not currently draining. Tympanic membrane difficult to appreciate due to wax build up bilaterally.  Cardiac: RRR, normal heart sounds, no murmurs Respiratory: CTAB, normal effort Extremities: No edema or  cyanosis. Skin: Warm and dry, multiple lesions appear as fat deposits both on back and groin area. No surrounding erythema or active drainage.   Media Information    Document Information  Photos  Back lesion  11/19/2020 14:35  Attached To:  [place] Bailiff   Source Information  Doreene Eland, MD  [facility]   Results for orders placed or performed in visit on 11/19/20 (from the past 48 hour(s))  HgB A1c     Status: None   Collection Time: 11/19/20  2:50 PM  Result Value Ref Range   Hemoglobin A1C 4.6 4.0 - 5.6 %   HbA1c POC (<> result, manual entry)     HbA1c, POC (prediabetic range)     HbA1c, POC (controlled diabetic range)       ASSESSMENT/PLAN:   Drainage from right ear x1 week. Has history of multiple ear infections. No recent illness or fever. Patient does not appear to be bothered by ear. No appreciable drainage but excessive cerumen present. Attempted to flush ears but patient was not able to tolerate. Would hold off on antibiotic ear drops for now. Give debrox and follow up if drainage does not improve or if symptoms worsen.  -Debrox drops -Follow up if drainage continues or patient presents with other symptoms of continued ear irritation.   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 Present in back as imaged above  and groin area. This can be difficult to treat as explained to sister. At this time we will refer to dermatology for management and symptomatic relief.  -Referral to dermatology  Increased frequency of urination Normal A1c. I suspected increase accidents are due to recent life events that has changed the patient normal activity. Discussed way with sister to attempt to normalize new life changes and verbalize to patient things prior to them happening. For example telling the patient when she is leaving and giving timeframe for when she will return and leaving something of hers with him for security.  -F/u if  continues to be an issue.  -Consider scheduled toilet schedule to attempt to avoid accidents   Lavonda Jumbo, DO [facility]

## 2020-11-19 NOTE — Patient Instructions (Addendum)
It was wonderful to see you today.  Please bring ALL of your medications with you to every visit.   Today we talked about:  Steatocystoma multiplex is a skin disorder characterized by the development of multiple noncancerous (benign) cysts known as steatocystomas. These growths begin in the skin's sebaceous glands, which normally produce an oily substance called sebum that lubricates the skin and hair. Steatocystomas are filled with sebum.  We have referred you to dermatology. This condition is hard to treat.   I have prescribed ear drops to break up ear wax. If pain and drainage continues please follow up in 1 week.  Accidents could be due to life changes as discussed. Will follow up A1c.    If you haven't already, sign up for My Chart to have easy access to your labs results, and communication with your primary care physician.  Please call the clinic at [phone] if your symptoms worsen or you have any concerns. It was our pleasure to serve you.  Dr. Salvadore Dom

## 2020-11-21 DIAGNOSIS — L722 Steatocystoma multiplex: Secondary | ICD-10-CM | POA: Insufficient documentation

## 2020-11-21 DIAGNOSIS — R350 Frequency of micturition: Secondary | ICD-10-CM | POA: Insufficient documentation

## 2020-11-21 DIAGNOSIS — H9211 Otorrhea, right ear: Secondary | ICD-10-CM | POA: Insufficient documentation

## 2020-11-21 NOTE — Assessment & Plan Note (Signed)
Normal A1c. I suspected increase accidents are due to recent life events that has changed the patient normal activity. Discussed way with sister to attempt to normalize new life changes and verbalize to patient things prior to them happening. For example telling the patient when she is leaving and giving timeframe for when she will return and leaving something of hers with him for security.  -F/u if continues to be an issue.  -Consider scheduled toilet schedule to attempt to avoid accidents

## 2020-11-21 NOTE — Assessment & Plan Note (Signed)
x1 week. Has history of multiple ear infections. No recent illness or fever. Patient does not appear to be bothered by ear. No appreciable drainage but excessive cerumen present. Attempted to flush ears but patient was not able to tolerate. Would hold off on antibiotic ear drops for now. Give debrox and follow up if drainage does not improve or if symptoms worsen.  -Debrox drops -Follow up if drainage continues or patient presents with other symptoms of continued ear irritation.

## 2020-11-21 NOTE — Assessment & Plan Note (Signed)
Lesion appear to be multiple small fat deposits. Present in back as imaged above and groin area. This can be difficult to treat as explained to sister. At this time we will refer to dermatology for management and symptomatic relief.  -Referral to dermatology

## 2020-11-22 ENCOUNTER — Encounter: Payer: Self-pay | Admitting: Family Medicine

## 2021-02-06 ENCOUNTER — Telehealth (INDEPENDENT_AMBULATORY_CARE_PROVIDER_SITE_OTHER): Payer: No Typology Code available for payment source | Admitting: Family Medicine

## 2021-02-06 DIAGNOSIS — Q909 Down syndrome, unspecified: Secondary | ICD-10-CM | POA: Diagnosis not present

## 2021-02-06 DIAGNOSIS — H269 Unspecified cataract: Secondary | ICD-10-CM | POA: Diagnosis not present

## 2021-02-06 NOTE — Progress Notes (Signed)
[facility] Telemedicine Visit  Patient consented to have virtual visit and was identified by name and date of birth. Method of visit: Telephone  Encounter participants: Patient: Brandon Bennett - located at Home Provider: Evelena Leyden - located off [facility] Others (if applicable): Mohammed Kindle (father)  Chief Complaint: Needs new referral   HPI:  Father spoke for the patient as patient is unable to communicate the current needs.  Patient has a history of Down Syndrome and previously seen by Dr. Maple Hudson for ophthalmology but Dr. Maple Hudson is now retiring and they are in need of a new specialist. Hx of cataracts per father and in need of surgery, unsure of when the timing will be as they are currently dealing with his insurance and disability.  ROS: per HPI  Pertinent PMHx: Down Syndrome  Exam:  There were no vitals taken for this visit.  Respiratory: unable to speak with patient due to his condition, spoke with father.   Assessment/Plan:  Cataract and Down Syndrome Patient in need of referral as prior ophthalmologist is no longer in practice. - Referral provided     Time spent during visit with patient: 5 minutes

## 2021-02-07 ENCOUNTER — Ambulatory Visit: Payer: No Typology Code available for payment source | Admitting: Student

## 2021-05-03 ENCOUNTER — Other Ambulatory Visit: Payer: Self-pay | Admitting: Family Medicine

## 2021-05-03 DIAGNOSIS — L732 Hidradenitis suppurativa: Secondary | ICD-10-CM

## 2021-08-28 NOTE — Progress Notes (Signed)
° ° °  SUBJECTIVE:   CHIEF COMPLAINT / HPI:   HS: Has had a history of this for some time.  Occasionally gets abscesses and lesions particularly in his groin.  Currently has a small lesion present on the left inside thigh.  Dad states he previously got a cleansing medication as well as doxycycline but has not been taking the doxycycline of late because that is concerned about bacterial resistance.  Rash: Has been there for years present on his back and chest as well as arms.  Does not seem to be better or worse.  Has previously tried some creams for it which did not seem to improve it. PERTINENT  PMH / PSH: History of Down syndrome  OBJECTIVE:   BP (!) 87/58    Pulse 97    Wt 95 lb 4 oz (43.2 kg)    SpO2 98%    BMI 18.60 kg/m    General: NAD, pleasant, able to participate in exam Cardiac: RRR, no murmurs. Respiratory: CTAB, normal effort, No wheezes, rales or rhonchi Skin: Inner thigh region with previous scarring in various stages of healing with 1 open lesion which is nondraining.  There is no palpable abscess on the make the lesion in the small approximately 2 mm in diameter. 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 Neuro: alert, no obvious focal deficits Psych: Normal affect and mood     ASSESSMENT/PLAN:   Hidradenitis suppurativa No abscesses palpated, no lesions in need of I&D at this time.  Discussed a trial of doxycycline for 2 months with reevaluation at 2 months.  Dad is interested in trying this.  We will also place a dermatology referral.   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  On physical exam he has open and close comedones with some nodularity.  No visible drainage.  Rash most consistent with nodulocystic acne.  We will place dermatology referral.  Patient will be on doxycycline 100 mg twice daily for his at bedtime.  This  should improve his nodulocystic acne to an extent as well but dermatology may have additional recommendations.  Please refill for his trazodone  Jackelyn Poling, DO [facility]

## 2021-08-29 ENCOUNTER — Encounter: Payer: Self-pay | Admitting: Family Medicine

## 2021-08-29 ENCOUNTER — Other Ambulatory Visit: Payer: Self-pay

## 2021-08-29 ENCOUNTER — Ambulatory Visit (INDEPENDENT_AMBULATORY_CARE_PROVIDER_SITE_OTHER): Payer: Medicaid Other | Admitting: Family Medicine

## 2021-08-29 VITALS — BP 87/58 | HR 97 | Wt 95.2 lb

## 2021-08-29 DIAGNOSIS — L700 Acne vulgaris: Secondary | ICD-10-CM | POA: Diagnosis not present

## 2021-08-29 DIAGNOSIS — L732 Hidradenitis suppurativa: Secondary | ICD-10-CM

## 2021-08-29 DIAGNOSIS — Q909 Down syndrome, unspecified: Secondary | ICD-10-CM

## 2021-08-29 DIAGNOSIS — R21 Rash and other nonspecific skin eruption: Secondary | ICD-10-CM

## 2021-08-29 MED ORDER — TRAZODONE HCL 50 MG PO TABS: 25.0000 mg | ORAL_TABLET | Freq: Every evening | ORAL | 1 refills | Status: DC | PRN

## 2021-08-29 MED ORDER — DOXYCYCLINE HYCLATE 100 MG PO CAPS: 100.0000 mg | ORAL_CAPSULE | Freq: Two times a day (BID) | ORAL | 0 refills | Status: DC

## 2021-08-29 NOTE — Assessment & Plan Note (Signed)
No abscesses palpated, no lesions in need of I&D at this time.  Discussed a trial of doxycycline for 2 months with reevaluation at 2 months.  Dad is interested in trying this.  We will also place a dermatology referral.

## 2021-08-29 NOTE — Patient Instructions (Signed)
For the rash on his back I think this is most likely nodulocystic acne.  The antibiotic we are going to prescribe for his hidradenitis can help with this but a referral for dermatology will be most useful and I have placed this today.  I will prescribe doxycycline for him to take twice daily for the next 2 months.  I would like for him to return in about 2 months to see how he is doing.  If he is not seeing a yearly eye doctor I do recommend that he do so.  If you develop any other concerns or questions please not hesitate follow-up

## 2021-12-17 IMAGING — DX DG CHEST 1V PORT
1 series · 1 of 1 positions shown · non-contrast
Comparison: 06/13/2016

CLINICAL DATA: Fever, hypotension

EXAM:
PORTABLE CHEST 1 VIEW

[chest ap]
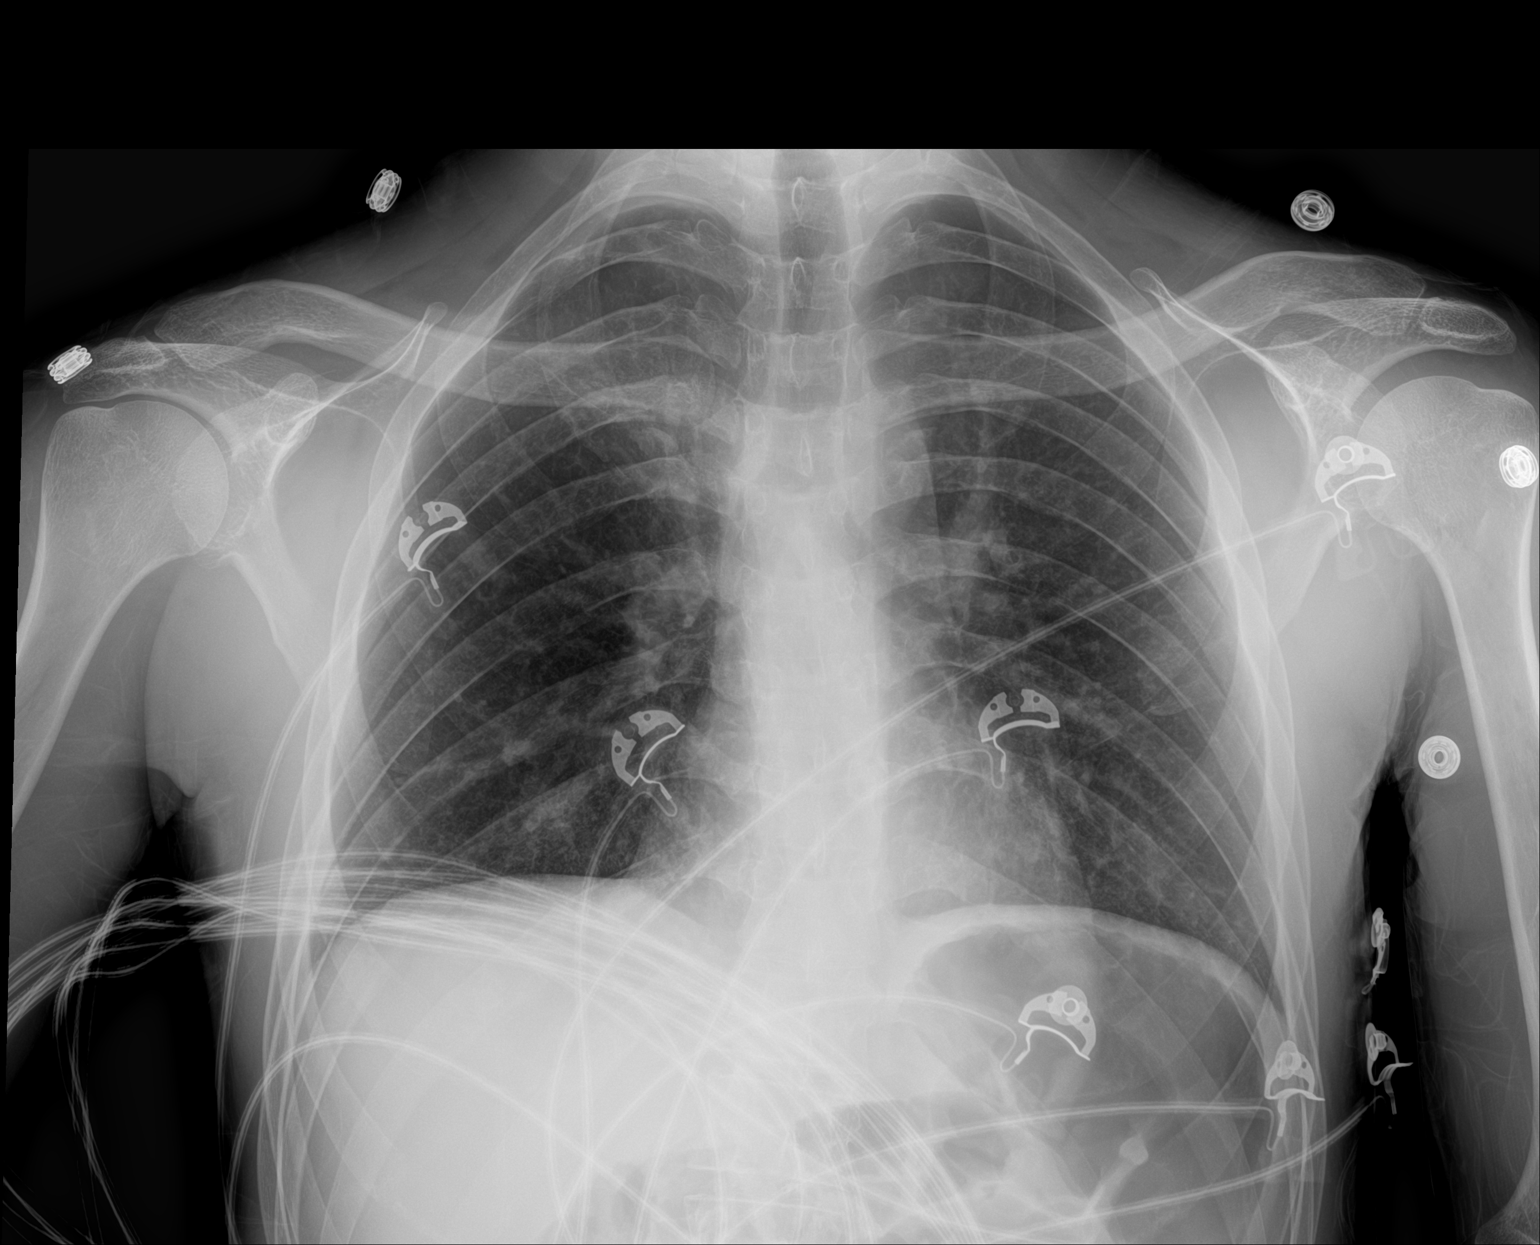

[1 of 1 positions shown; findings below may reference images not displayed]

FINDINGS: The heart size and mediastinal contours are within normal limits.
Both lungs are clear. The visualized skeletal structures are
unremarkable.
IMPRESSION: No acute abnormality of the lungs in AP portable projection.

## 2022-05-15 ENCOUNTER — Ambulatory Visit (INDEPENDENT_AMBULATORY_CARE_PROVIDER_SITE_OTHER): Payer: Medicaid Other | Admitting: Family Medicine

## 2022-05-15 ENCOUNTER — Encounter: Payer: Self-pay | Admitting: Family Medicine

## 2022-05-15 VITALS — BP 102/74 | HR 107 | Temp 98.1°F | Wt 96.2 lb

## 2022-05-15 DIAGNOSIS — R630 Anorexia: Secondary | ICD-10-CM | POA: Diagnosis not present

## 2022-05-15 DIAGNOSIS — Q909 Down syndrome, unspecified: Secondary | ICD-10-CM

## 2022-05-15 DIAGNOSIS — R739 Hyperglycemia, unspecified: Secondary | ICD-10-CM

## 2022-05-15 DIAGNOSIS — R269 Unspecified abnormalities of gait and mobility: Secondary | ICD-10-CM | POA: Diagnosis not present

## 2022-05-15 DIAGNOSIS — G4700 Insomnia, unspecified: Secondary | ICD-10-CM | POA: Diagnosis not present

## 2022-05-15 DIAGNOSIS — R350 Frequency of micturition: Secondary | ICD-10-CM

## 2022-05-15 DIAGNOSIS — R509 Fever, unspecified: Secondary | ICD-10-CM | POA: Diagnosis not present

## 2022-05-15 DIAGNOSIS — L732 Hidradenitis suppurativa: Secondary | ICD-10-CM | POA: Diagnosis not present

## 2022-05-15 MED ORDER — TRAZODONE HCL 50 MG PO TABS: 25.0000 mg | ORAL_TABLET | Freq: Every evening | ORAL | 5 refills | Status: DC | PRN

## 2022-05-15 NOTE — Progress Notes (Unsigned)
    SUBJECTIVE:   CHIEF COMPLAINT / HPI:  No chief complaint on file.   Here with father.  He has had intermittent fevers (low 100s) for about 1 week. Also not eating well for about 1 week. He has had urinary frequency for 1 month. They have stopped giving him soda, drinking more water now.  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  Physical Exam      11/26/2016    5:10 PM  Depression screen PHQ 2/9  Decreased Interest 0  Down, Depressed, Hopeless 0  PHQ - 2 Score 0     {Show previous vital signs (optional):23777}  {Labs  Heme  Chem  Endocrine  Serology  Results Review (optional):23779}  ASSESSMENT/PLAN:   No problem-specific Assessment & Plan notes found for this encounter.    No follow-ups on file.   Zola Button, MD [place]

## 2022-05-15 NOTE — Patient Instructions (Addendum)
It was nice seeing you today!  We are running blood tests and urine tests today.  I am sending referrals for speech therapy, PT, and OT.  Stay well, Zola Button, MD [place] [phone]  --  Make sure to check out at the front desk before you leave today.  Please arrive at least 15 minutes prior to your scheduled appointments.  If you had blood work today, I will send you a MyChart message or a letter if results are normal. Otherwise, I will give you a call.  If you had a referral placed, they will call you to set up an appointment. Please give [place] a call if you don't hear back in the next 2 weeks.  If you need additional refills before your next appointment, please call your pharmacy first.

## 2022-05-16 ENCOUNTER — Encounter: Payer: Self-pay | Admitting: Family Medicine

## 2022-05-16 LAB — COMPREHENSIVE METABOLIC PANEL
ALT: 38 IU/L (ref 0–44)
AST: 30 IU/L (ref 0–40)
Albumin/Globulin Ratio: 1.1 — ABNORMAL LOW (ref 1.2–2.2)
Albumin: 4 g/dL — ABNORMAL LOW (ref 4.3–5.2)
Alkaline Phosphatase: 134 IU/L — ABNORMAL HIGH (ref 44–121)
BUN/Creatinine Ratio: 20 (ref 9–20)
BUN: 16 mg/dL (ref 6–20)
Bilirubin Total: 1 mg/dL (ref 0.0–1.2)
CO2: 28 mmol/L (ref 20–29)
Calcium: 8.6 mg/dL — ABNORMAL LOW (ref 8.7–10.2)
Chloride: 103 mmol/L (ref 96–106)
Creatinine, Ser: 0.81 mg/dL (ref 0.76–1.27)
Globulin, Total: 3.7 g/dL (ref 1.5–4.5)
Glucose: 73 mg/dL (ref 70–99)
Potassium: 4 mmol/L (ref 3.5–5.2)
Sodium: 141 mmol/L (ref 134–144)
Total Protein: 7.7 g/dL (ref 6.0–8.5)
eGFR: 127 mL/min/{1.73_m2} (ref >59)

## 2022-05-16 LAB — CBC WITH DIFFERENTIAL/PLATELET
Basophils Absolute: 0.1 10*3/uL (ref 0.0–0.2)
Basos: 1 %
EOS (ABSOLUTE): 0.1 10*3/uL (ref 0.0–0.4)
Eos: 1 %
Hematocrit: 42 % (ref 37.5–51.0)
Hemoglobin: 14.1 g/dL (ref 13.0–17.7)
Immature Grans (Abs): 0 10*3/uL (ref 0.0–0.1)
Immature Granulocytes: 0 %
Lymphocytes Absolute: 1.9 10*3/uL (ref 0.7–3.1)
Lymphs: 19 %
MCH: 34.4 pg — ABNORMAL HIGH (ref 26.6–33.0)
MCHC: 33.6 g/dL (ref 31.5–35.7)
MCV: 102 fL — ABNORMAL HIGH (ref 79–97)
Monocytes Absolute: 0.6 10*3/uL (ref 0.1–0.9)
Monocytes: 6 %
Neutrophils Absolute: 7.3 10*3/uL — ABNORMAL HIGH (ref 1.4–7.0)
Neutrophils: 73 %
Platelets: 146 10*3/uL — ABNORMAL LOW (ref 150–450)
RBC: 4.1 x10E6/uL — ABNORMAL LOW (ref 4.14–5.80)
RDW: 11.6 % (ref 11.6–15.4)
WBC: 9.9 10*3/uL (ref 3.4–10.8)

## 2022-05-16 LAB — HEMOGLOBIN A1C
Est. average glucose Bld gHb Est-mCnc: 80 mg/dL
Hgb A1c MFr Bld: 4.4 % — ABNORMAL LOW (ref 4.8–5.6)

## 2022-05-16 LAB — TSH RFX ON ABNORMAL TO FREE T4: TSH: 0.789 u[IU]/mL (ref 0.450–4.500)

## 2022-05-16 NOTE — Assessment & Plan Note (Signed)
Continue trazodone as above

## 2022-05-16 NOTE — Assessment & Plan Note (Signed)
New lesion noted today.  Management per dermatology, father has doxycycline at home when needed.  Do not feel he necessarily needs to start antibiotics today.

## 2022-05-16 NOTE — Assessment & Plan Note (Signed)
-   continue trazodone to help with insomnia, OK to take every night if needed. Refilled. - check TSH - referral PT, OT, SLP

## 2022-06-03 ENCOUNTER — Other Ambulatory Visit: Payer: Self-pay

## 2022-06-03 ENCOUNTER — Ambulatory Visit: Payer: Medicaid Other | Attending: Family Medicine

## 2022-06-03 DIAGNOSIS — F802 Mixed receptive-expressive language disorder: Secondary | ICD-10-CM | POA: Diagnosis not present

## 2022-06-03 DIAGNOSIS — F801 Expressive language disorder: Secondary | ICD-10-CM

## 2022-06-03 NOTE — Therapy (Signed)
OUTPATIENT SPEECH LANGUAGE PATHOLOGY RECEPTIVE AND EXPRESSIVE LANGUAGE EVALUATION   Patient Name: Brandon Bennett MRN: [id number] DOB:06/01/2001, 21 y.o., male Today's Date: 06/03/2022  PCP: McDiarmid, Tawanna Cooler, MD REFERRING PROVIDER: same  END OF SESSION:  End of Session - 06/03/22 1829     Visit Number 1    Number of Visits 9    Date for SLP Re-Evaluation 09/01/22    Authorization Type medicaid [place] access    SLP Start Time 1533    SLP Stop Time  1615    SLP Time Calculation (min) 42 min    Activity Tolerance Treatment limited secondary to agitation             Past Medical History:  Diagnosis Date   Down syndrome    History reviewed. No pertinent surgical history. 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 Pt accompanied by: self and family member mother and Eloise Harman - sister  PERTINENT HISTORY: Pt dx with Down's Sydrome. Had ST all through school. Graduated in 2021. Verbal expression has decr'd since COVID and pt rarely communicates in words at this time.  PAIN:  Are you having pain?  Pt did not indicate any pain  FALLS: Has patient fallen in last 6 months?  No  LIVING ENVIRONMENT: Lives with: lives with their family Lives in: House/apartment  PLOF:  Level of assistance: Needed assistance with ADLs, Needed assistance with  IADLS Employment: Other: does not work   PATIENT GOALS: Family goal: pt would be more verbal  OBJECTIVE:  COGNITION: Overall cognitive status: Impaired Areas of impairment:  Attention: Impaired: Focused, Sustained, Selective, Alternating, Divided - pt appeared inattentive to speakers 90% of the time. Behavior: Restless, Impulsive, and Physical agitation in that pt was getting up intermittently throughout eval, clicking tongue, grinding teeth, rubbing hands, banging palms together Functional deficits: Pt dependent upon family to meet his needs  AUDITORY COMPREHENSION: Overall auditory comprehension: Impaired: simple YES/NO questions: Impaired: simple; sister and mother agreed pt did not answer yes/no questions 95% of the time when he was more verbal Following directions: Impaired: simple; followed direction 1/5 to open mouth with max cues and model. Conversation:  Pt did not hold any conversation with SLP today Interfering components: attention, anxiety, processing speed, and intellectual disability Effective technique: repetition/stressing words - however appeared minimally effective at this time  READING COMPREHENSION: Unable to be tested  EXPRESSION: nonverbal- gestures  VERBAL EXPRESSION: Level of generative/spontaneous verbalization: no discernable words heard today - pt laughed intermittently x3 at inappropriate times Automatic speech: name: impaired  Naming: Confrontation: sister said that pt named letters with >60% accuracy after max cues/repetition Pragmatics: Impaired: abnormal effect and eye contact Comments: Pt did not hold eye contact with speaker 95% of the time. Interfering components: attention and premorbid deficit Effective technique:  none found today Non-verbal means of communication: gestures (per family).  Of note, pt "gave five" when verbally and gesturally prompted to do  so 2/5.  WRITTEN EXPRESSION: N/A  MOTOR SPEECH: Pt did not follow commands to allow  for adequate assessment today. 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  STANDARDIZED ASSESSMENTS: Informal assessment of pragmatics, and via interview format  PATIENT REPORTED OUTCOME MEASURES (PROM): To be completed during first 1-2 sessions by sister and/or mother   TODAY'S TREATMENT:                                                                                                                                         N/A  PATIENT/FAMILY EDUCATION: Education details: See below in "clinical impression" Person educated: Patient, Parent, and sister Education method: Explanation Education comprehension: family verbalized understanding and ? Comprehension by pt   GOALS: Goals reviewed with patient family? Yes  SHORT TERM GOALS: Target date: 08/01/22  Pt will hold eye contact (even fleeting) with speaker 25% of the time, in 3 sessions Baseline: 5% Goal status: INITIAL  2.  Pt will answer simple questions such as "what is your name?" "What does a (animal) say?" 15% of the time with usual min A, in 2 sessions Baseline: 0% Goal status: INITIAL  3.  Pt will engage in socially appropriate interactive behavior of "giving five" or "fist bump" with occasional min-mod A in 40% of opportunities over 3 sessions Baseline: 40% with usual mod A Goal status: INITIAL   LONG TERM GOALS: Target date: 09/01/22  Pt will hold eye contact (even fleeting) with speaker 35% of  the time, in 3 sessions Baseline: 5% Goal status: INITIAL  2.  Pt will answer simple questions such as "what is your name?", etc with usual min A 20% of the time, in 2 sessions Baseline: 0% Goal status: INITIAL  3.  Pt will engage in socially appropriate interactive behavior of "giving five" or "fist bump" with occasional min A in 50% of opportunities over 3 sessions Baseline: 40% usual mod A Goal status: INITIAL   ASSESSMENT:  CLINICAL IMPRESSION: Patient is a 21 y.o. male who was seen today for informal assessment mostly via interview. Amdrew exhibits profound expressive language disorder and likely a severe or profound receptive language disorder. Pt produced pragmatically inappropriate behaviors today (which may or may not be due to anxiety in new situations) of getting up out of his chair intermittently x4 throughout eval, clicking tongue, grinding teeth, rubbing hands, rubbing his face and head, and banging palms together. He did not hold eye contact with speaker  talking to him 95% of the time. Prior to 2020, pt reportedly responded appropriately to simple "wh" questions, told family, "I want... (simple food or drink)", and would rarely/occasionally answer yes/no questions with answers associated with high interest items (e.g., family: "Do you want to watch sponge bob?"  pt: "Yeah, sponge bob."). Pt would reportedly also respond in repetitive verbal responses. Example: Sister asked "Am I pretty or ugly?" And pt respond, "Ugly!" And laugh with sister. Pt now engages in very little/no verbal expression and social interaction at this time. Since graduation he has been at home with mother during the day without any other regular social interaction. SLP strongly recommended family seek out regular social interaction (I.e, a social interaction group, or a day program) for [place] as this is a crucial part of remaining more verbal/socially interactive.  OBJECTIVE IMPAIRMENTS: include attention, memory,  awareness, executive functioning, expressive language, and receptive language. These impairments are limiting patient from ADLs/IADLs and effectively communicating at home and in community. Factors affecting potential to achieve goals and functional outcome are ability to learn/carryover information, co-morbidities, cooperation/participation level, medical prognosis, previous level of function, and severity of impairments. Patient will benefit from skilled SLP services to address above impairments and improve overall function.  REHAB POTENTIAL: Fair given medical dx  PLAN:  SLP FREQUENCY: 1x/week  SLP DURATION: 8 weeks  PLANNED INTERVENTIONS: Language facilitation, Environmental controls, Cueing hierachy, Internal/external aids, Functional tasks, SLP instruction and feedback, Compensatory strategies, and Patient/family education    [facility], CCC-SLP 06/03/2022, 6:30 PM

## 2022-06-10 ENCOUNTER — Ambulatory Visit: Payer: Medicaid Other

## 2022-06-12 ENCOUNTER — Ambulatory Visit: Payer: Medicaid Other | Attending: Family Medicine | Admitting: Speech Pathology

## 2022-06-12 DIAGNOSIS — F801 Expressive language disorder: Secondary | ICD-10-CM | POA: Insufficient documentation

## 2022-06-12 DIAGNOSIS — F802 Mixed receptive-expressive language disorder: Secondary | ICD-10-CM | POA: Insufficient documentation

## 2022-06-12 DIAGNOSIS — Q909 Down syndrome, unspecified: Secondary | ICD-10-CM | POA: Diagnosis present

## 2022-06-12 DIAGNOSIS — Z789 Other specified health status: Secondary | ICD-10-CM | POA: Diagnosis present

## 2022-06-12 NOTE — Therapy (Signed)
OUTPATIENT SPEECH LANGUAGE PATHOLOGY TREATMENT   Patient Name: Brandon Bennett MRN: [id number] DOB:Jan 12, 2001, 21 y.o., male Today's Date: 06/12/2022  PCP: McDiarmid, Tawanna Cooler, MD REFERRING PROVIDER: same  END OF SESSION:  End of Session - 06/12/22 1401     Visit Number 2    Number of Visits 9    Date for SLP Re-Evaluation 09/01/22    Authorization Type medicaid [place] access    SLP Start Time 1401    SLP Stop Time  1445    SLP Time Calculation (min) 44 min    Activity Tolerance Treatment limited secondary to agitation             Past Medical History:  Diagnosis Date   Down syndrome    No past surgical history on file. 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  PAIN:  Are you having pain?  Pt did not indicate any pain  OBJECTIVE:  PATIENT REPORTED OUTCOME MEASURES (PROM): To be completed during first 1-2 sessions by sister and/or mother   TODAY'S TREATMENT:                                                                                                                                         06/12/2022: Focus of session is education for parents (primary caregivers) regarding importance of social engagement and cognitively stimulating and socially engaging, appropriate opportunities for [place] to encourage ongoing use of language,  as mother reports [name]'s verbal language has decreased concurrently with decrease in interactions with graduation from school and sisters leaving home. Barrier is transportation, as pt is home with Mother throughout the week and she does not drive. SLP modeled pt first language throughout session, primarily rote phrases, which resulted in pt verbalizing "go" and "my turn" 1x each. Education on how parents can incorporate at home. Encouraged family to carve out short periods of time throughout the day in which activities can be [name]-centered or engaging him in tasks such as cooking which he enjoys. Parents verbalize understanding of all information presented.   PATIENT/FAMILY EDUCATION: Education details: See above Person educated: Patient, Parent, and sister Education method: Explanation Education comprehension: family verbalized understanding and ? Comprehension by pt   GOALS: Goals reviewed with patient family? Yes  SHORT TERM GOALS: Target date: 08/01/22  Pt will hold eye contact (even  fleeting) with speaker 25% of the time, in 3 sessions Baseline: 5% Goal status: INITIAL  2.  Pt will answer simple questions such as "what is your name?" "What does a (animal) say?" 15% of the time with usual min A, in 2 sessions Baseline: 0% Goal status: INITIAL  3.  Pt will engage in socially appropriate interactive behavior of "giving five" or "fist bump" with occasional min-mod A in 40% of opportunities over 3 sessions Baseline: 40% with usual mod A Goal status: INITIAL   LONG TERM GOALS: Target date: 09/01/22  Pt will hold eye contact (even fleeting) with speaker 35% of the time, in 3 sessions Baseline: 5% Goal status: INITIAL  2.  Pt will answer simple questions such as "what is your name?", etc with usual min A 20% of the time, in 2 sessions Baseline: 0% Goal status: INITIAL  3.  Pt will engage in socially appropriate interactive behavior of "giving five" or "fist bump" with occasional  min A in 50% of opportunities over 3 sessions Baseline: 40% usual mod A Goal status: INITIAL   ASSESSMENT:  CLINICAL IMPRESSION: Patient is a 21 y.o. male who was seen today for informal assessment mostly via interview. Elin exhibits profound expressive language disorder and likely a severe or profound receptive language disorder. Pt produced pragmatically inappropriate behaviors today (which may or may not be due to anxiety in new situations) of getting up out of his chair intermittently x4 throughout eval, clicking tongue, grinding teeth, rubbing hands, rubbing his face and head, and banging palms together. He did not hold eye contact with speaker talking to him 95% of the time. Prior to 2020, pt reportedly responded appropriately to simple "wh" questions, told family, "I want... (simple food or drink)", and would rarely/occasionally answer yes/no questions with answers associated with high interest items (e.g., family: "Do you want to watch sponge bob?"  pt: "Yeah, sponge bob."). Pt would reportedly also respond in repetitive verbal responses. Example: Sister asked "Am I pretty or ugly?" And pt respond, "Ugly!" And laugh with sister. Pt now engages in very little/no verbal expression and social interaction at this time. Since graduation he has been at home with mother during the day without any other regular social interaction. SLP strongly recommended family seek out regular social interaction (I.e, a social interaction group, or a day program) for [name] as this is a crucial part of remaining more verbal/socially interactive.  OBJECTIVE IMPAIRMENTS: include attention, memory, awareness, executive functioning, expressive language, and receptive language. These impairments are limiting patient from ADLs/IADLs and effectively communicating at home and in community. Factors affecting potential to achieve goals and functional outcome are ability to learn/carryover information, co-morbidities,  cooperation/participation level, medical prognosis, previous level of function, and severity of impairments. Patient will benefit from skilled SLP services to address above impairments and improve overall function.  REHAB POTENTIAL: Fair given medical dx  PLAN:  SLP FREQUENCY: 1x/week  SLP DURATION: 8 weeks  PLANNED INTERVENTIONS: Language facilitation, Environmental controls, Cueing hierachy, Internal/external aids, Functional tasks, SLP instruction and feedback, Compensatory strategies, and Patient/family education    Maia Breslow, CCC-SLP 06/12/2022, 2:02 PM

## 2022-06-18 ENCOUNTER — Ambulatory Visit: Payer: Medicaid Other | Admitting: Occupational Therapy

## 2022-06-18 ENCOUNTER — Encounter: Payer: Self-pay | Admitting: Occupational Therapy

## 2022-06-18 DIAGNOSIS — Z789 Other specified health status: Secondary | ICD-10-CM

## 2022-06-18 DIAGNOSIS — F802 Mixed receptive-expressive language disorder: Secondary | ICD-10-CM | POA: Diagnosis not present

## 2022-06-18 DIAGNOSIS — Q909 Down syndrome, unspecified: Secondary | ICD-10-CM

## 2022-06-18 NOTE — Therapy (Signed)
OUTPATIENT OCCUPATIONAL THERAPY EVALUATION  Patient Name: Brandon Bennett MRN: [id number] DOB:02-18-2001, 21 y.o., male Today's Date: 06/18/2022  PCP: Littie Deeds, MD  REFERRING PROVIDER: Littie Deeds, MD   END OF SESSION:  OT End of Session - 06/18/22 1442     Visit Number 1    Number of Visits 9    Date for OT Re-Evaluation 08/14/22    Authorization Type Medicaid [place]    OT Start Time 1445    OT Stop Time 1517    OT Time Calculation (min) 32 min    Activity Tolerance Patient tolerated treatment well    Behavior During Therapy WFL for tasks assessed/performed             Past Medical History:  Diagnosis Date   Down syndrome    History reviewed. No pertinent surgical history. 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 He enjoys watching TV, sleeping, eating, music and dancing. He enjoyed playing keyboard and spinning when he was in school. He is able to access videos on YouTube. When he watches tv or uses the phone, he will be sedentary and incontinent. He goes to bed at 11 pm and wakes up at 10:30 or 11 am. He takes Trazodone to help with sleep because before he would only sleep 2 hours. He did not require sleep medication before COVID. He makes noises a lot and when his family asks him to stop, he smacks  them.   Pt accompanied by:  mother  - Nujushar - goes by (sar)  PERTINENT HISTORY: Pt dx with Down's Sydrome. Had ST, OT, and PT all through school. Graduated in 2021. Verbal expression has decr'd since COVID and pt rarely communicates in words at this time with ADL decline.   PRECAUTIONS: Other: pt is mostly non-verbal  WEIGHT BEARING RESTRICTIONS: No  PAIN:  Are you having pain? No  FALLS: Has patient fallen in last 6 months? No  LIVING ENVIRONMENT: Lives with: lives with their family and with mother and father Lives in: House/apartment Stairs: Yes: Internal: 2 steps; none Has following equipment at home: None  PLOF: Needs assistance with ADLs  PATIENT GOALS: Mom would like for pt to be more engaging and complete ADLs at PLOF.   OBJECTIVE:   HAND DOMINANCE: Right  ADLs: Overall ADLs: mother has to initiate since COVID with no more than 25% assistance from mother; she had been able to set clothes out for patient but now has to physically assist pt IADLs:  Prior to COVID, he put away clean laundry  FUNCTIONAL OUTCOME MEASURES:    BUE ROM and MMT : WLF  COORDINATION: Was able to write name when he was in school.  Has not recently attempted.  COGNITION: Overall cognitive status: History of cognitive impairments - at baseline and see ST eval for further details.   OBSERVATIONS:  Pt demonstrating clicking tongue, grinding teeth, rubbing hands, rubbing his face and head, and banging palms together throughout session.   TODAY'S TREATMENT:                                                                                                                               Assessment only  PATIENT EDUCATION: Education details: POC Person educated: Parent Education method: Explanation Education comprehension: verbalized understanding and needs further education  HOME EXERCISE PROGRAM: Assessment only  GOALS: Goals reviewed with parent? Yes  SHORT TERM GOALS: Target date:  07/17/2022   Caregiver will demonstrate appropriate use of communication board.   Baseline: does not utilize Goal status: INITIAL  2.  Patient's mother will report at least 1-point increase in average PSFS score or at least 2-point increase in a single activity score.  Baseline: PSFS score =1.3; see above for full details Goal status: INITIAL    LONG TERM GOALS: Target date: 08/14/2022    Patient's mother will report at least two-point increase in average PSFS score or at least three-point increase in a single activity score indicating functionally significant improvement given minimum detectable change.  Baseline: PSFS score =1.3; see above for full details Goal status: INITIAL  2.  Pt will utilize communication board at home on a daily basis.  Baseline: does not utilize Goal status: INITIAL   ASSESSMENT:  CLINICAL IMPRESSION: Patient is a 21 y.o. male who was seen today for occupational therapy evaluation due to hx of Downs Syndrome with decline in ADL participation. Hx includes insomnia. Patient currently presents below baseline level of functioning demonstrating functional deficits and impairments as noted below. Pt would benefit from skilled OT services in the outpatient setting to work on impairments as noted below to help pt return to PLOF as able.    PERFORMANCE DEFICITS: in functional skills including ADLs, IADLs, and UE functional use  IMPAIRMENTS: are limiting patient from ADLs and play.   COMORBIDITIES: has co-morbidities such as inappropriate behavior and history of limited engagement/ability to follow commands  that affects occupational performance. Patient will benefit from skilled OT to address above impairments and improve overall function.  MODIFICATION OR ASSISTANCE TO COMPLETE EVALUATION: Min-Moderate modification of tasks or assist with assess necessary to complete an evaluation.  OT OCCUPATIONAL PROFILE AND HISTORY: Detailed assessment: Review of records and  additional review of physical, cognitive, psychosocial history related to current functional performance.  CLINICAL DECISION MAKING: Moderate - several treatment options, min-mod task modification necessary  REHAB POTENTIAL: Fair given behavioral and cognitive impairments  EVALUATION COMPLEXITY: Moderate      PLAN:  OT FREQUENCY: 1x/week  OT DURATION: 8 weeks  PLANNED INTERVENTIONS: self care/ADL training, therapeutic exercise, therapeutic activity, patient/family education, cognitive remediation/compensation, coping strategies training, DME and/or AE instructions, and Re-evaluation  RECOMMENDED OTHER SERVICES: none at this time  CONSULTED AND AGREED WITH PLAN OF CARE: family member/caregiver  PLAN FOR NEXT SESSION: initiate use of communication board.  Delana Meyer, OT 06/18/2022, 5:41 PM

## 2022-06-18 NOTE — Patient Instructions (Signed)
[email or web address]   [email or web address]   [email or web address]   [email or web address]   [email or web address]   [email or web address]     

## 2022-06-19 ENCOUNTER — Encounter: Payer: Medicaid Other | Admitting: Speech Pathology

## 2022-06-19 ENCOUNTER — Ambulatory Visit: Payer: Medicaid Other | Admitting: Speech Pathology

## 2022-06-19 DIAGNOSIS — F802 Mixed receptive-expressive language disorder: Secondary | ICD-10-CM

## 2022-06-19 DIAGNOSIS — F801 Expressive language disorder: Secondary | ICD-10-CM

## 2022-06-19 NOTE — Therapy (Signed)
OUTPATIENT SPEECH LANGUAGE PATHOLOGY TREATMENT   Patient Name: Brandon Bennett MRN: [id number] DOB:Nov 14, 2000, 21 y.o., male Today's Date: 06/19/2022  PCP: McDiarmid, Tawanna Cooler, MD REFERRING PROVIDER: same  END OF SESSION:  End of Session - 06/19/22 1449     Visit Number 3    Number of Visits 9    Date for SLP Re-Evaluation 09/01/22    Authorization Type medicaid [place] access    SLP Start Time 1450    SLP Stop Time  1530    SLP Time Calculation (min) 40 min    Activity Tolerance Patient tolerated treatment well             Past Medical History:  Diagnosis Date   Down syndrome    No past surgical history on file. 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  Pt did not indicate any pain  OBJECTIVE:  PATIENT REPORTED OUTCOME MEASURES (PROM): To be completed during first 1-2 sessions by sister and/or mother   TODAY'S TREATMENT:                                                                                                                                         06-19-22: SLP provided education on potential resources which may A family in meeting [name]'s social emotional needs. Ongoing education regarding Brandon Bennett having communication skills (per family report)  yet not employing them which appears to be correlated with reduction in social interactions. SLP generated low tech AAC board for basic needs and leisure activities, using FO4 text + picture. Training on how to implement into routine to aid in communication efficacy on behalf of patient. Pt mother able to demonstrate use of low tech AAC with pt x2 with rare mod-I. SLP demonstrated speech generating device with pt appearing interested. Education on potential indications being Brandon Bennett not currently meeting communication needs with natural communication, being physically and cognitively able to navigate tech devices, and demonstrated interest in device presented. Benefits of SGD are autonomy of communication, verbal model in which pt can imitate, high degree of customization, potentially motivating for pt to employ.   06/12/2022: Focus of session is education for parents (primary caregivers) regarding importance of social engagement and cognitively stimulating and socially engaging, appropriate opportunities  for Jazper to encourage ongoing use of language, as mother reports Brandon Bennett's verbal language has decreased concurrently with decrease in interactions with graduation from school and sisters leaving home. Barrier is transportation, as pt is home with Mother throughout the week and she does not drive. SLP modeled pt first language throughout session, primarily rote phrases, which resulted in pt verbalizing "go" and "my turn" 1x each. Education on how parents can incorporate at home. Encouraged family to carve out short periods of time throughout the day in which activities can be Brandon Bennett-centered or engaging him in tasks such as cooking which he enjoys. Parents verbalize understanding of all information presented.   PATIENT/FAMILY EDUCATION: Education details: See above Person educated: Patient, Parent, and sister Education method: Explanation Education comprehension: family verbalized understanding and ?  Comprehension by pt   GOALS: Goals reviewed with patient family? Yes  SHORT TERM GOALS: Target date: 08/01/22  Pt will hold eye contact (even fleeting) with speaker 25% of the time, in 3 sessions Baseline: 5% Goal status: INITIAL  2.  Pt will answer simple questions such as "what is your name?" "What does a (animal) say?" 15% of the time with usual min A, in 2 sessions Baseline: 0% Goal status: INITIAL  3.  Pt will engage in socially appropriate interactive behavior of "giving five" or "fist bump" with occasional min-mod A in 40% of opportunities over 3 sessions Baseline: 40% with usual mod A Goal status: INITIAL   LONG TERM GOALS: Target date: 09/01/22  Pt will hold eye contact (even fleeting) with speaker 35% of the time, in 3 sessions Baseline: 5% Goal status: INITIAL  2.  Pt will answer simple questions such as "what is your name?", etc with usual min A 20% of the time, in 2 sessions Baseline: 0% Goal status: INITIAL  3.  Pt will engage in socially appropriate interactive behavior of "giving five" or "fist bump" with occasional min A in 50% of opportunities over 3 sessions Baseline: 40% usual mod A Goal status: INITIAL   ASSESSMENT:  CLINICAL IMPRESSION: Patient is a 21 y.o. male who was seen today for informal assessment mostly via interview. Brandon Bennett exhibits profound expressive language disorder and likely a severe or profound receptive language disorder. Pt produced pragmatically inappropriate behaviors today (which may or may not be due to anxiety in new situations) of getting up out of his chair intermittently x4 throughout eval, clicking tongue, grinding teeth, rubbing hands, rubbing his face and head, and banging palms together. He did not hold eye contact with speaker talking to him 95% of the time. Prior to 2020, pt reportedly responded appropriately to simple "wh" questions, told family, "I want... (simple food or drink)", and would rarely/occasionally answer yes/no  questions with answers associated with high interest items (e.g., family: "Do you want to watch sponge bob?"  pt: "Yeah, sponge bob."). Pt would reportedly also respond in repetitive verbal responses. Example: Sister asked "Am I pretty or ugly?" And pt respond, "Ugly!" And laugh with sister. Pt now engages in very little/no verbal expression and social interaction at this time. Since graduation he has been at home with mother during the day without any other regular social interaction. SLP strongly recommended family seek out regular social interaction (I.e, a social interaction group, or a day program) for Domenik as this is a crucial part of remaining more verbal/socially interactive.  OBJECTIVE IMPAIRMENTS: include attention, memory, awareness, executive functioning, expressive language, and receptive language. These impairments are limiting patient from ADLs/IADLs and effectively  communicating at home and in community. Factors affecting potential to achieve goals and functional outcome are ability to learn/carryover information, co-morbidities, cooperation/participation level, medical prognosis, previous level of function, and severity of impairments. Patient will benefit from skilled SLP services to address above impairments and improve overall function.  REHAB POTENTIAL: Fair given medical dx  PLAN:  SLP FREQUENCY: 1x/week  SLP DURATION: 8 weeks  PLANNED INTERVENTIONS: Language facilitation, Environmental controls, Cueing hierachy, Internal/external aids, Functional tasks, SLP instruction and feedback, Compensatory strategies, and Patient/family education    Maia Breslow, CCC-SLP 06/19/2022, 2:50 PM

## 2022-06-26 ENCOUNTER — Ambulatory Visit: Payer: Medicaid Other | Admitting: Occupational Therapy

## 2022-06-26 ENCOUNTER — Ambulatory Visit: Payer: Medicaid Other | Admitting: Speech Pathology

## 2022-06-26 ENCOUNTER — Encounter: Payer: Medicaid Other | Admitting: Occupational Therapy

## 2022-06-26 ENCOUNTER — Ambulatory Visit: Payer: Medicaid Other

## 2022-06-26 ENCOUNTER — Encounter: Payer: Medicaid Other | Admitting: Speech Pathology

## 2022-06-26 DIAGNOSIS — F802 Mixed receptive-expressive language disorder: Secondary | ICD-10-CM | POA: Diagnosis not present

## 2022-06-26 DIAGNOSIS — F801 Expressive language disorder: Secondary | ICD-10-CM

## 2022-06-26 NOTE — Patient Instructions (Signed)
[email or web address]   [email or web address]   [email or web address]   [email or web address]   [email or web address]   [email or web address]

## 2022-06-26 NOTE — Therapy (Addendum)
OUTPATIENT SPEECH LANGUAGE PATHOLOGY TREATMENT   Patient Name: Brandon Bennett MRN: [id number] DOB:18-Jun-2001, 21 y.o., male Today's Date: 06/26/2022  PCP: McDiarmid, Tawanna Cooler, MD REFERRING PROVIDER: same  END OF SESSION:  End of Session - 06/26/22 1404     Visit Number 4    Number of Visits 9    Date for SLP Re-Evaluation 09/01/22    Authorization Type medicaid [place] access    SLP Start Time 1405    SLP Stop Time  1444    SLP Time Calculation (min) 39 min    Activity Tolerance Patient tolerated treatment well              Past Medical History:  Diagnosis Date   Down syndrome    No past surgical history on file. 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  Pt did not indicate any pain  OBJECTIVE:  PATIENT REPORTED OUTCOME MEASURES (PROM): To be completed during first 1-2 sessions by sister and/or mother   TODAY'S TREATMENT:                                                                                                                                         06-26-22: SLP introduced pt to [employer] SNAP! To probe for suitability for implementation of speech generating device (SGD) high tech AAC option. Pt has evidenced little verbalizations  within therapy sessions and patient's family members endorse little spontaneous speech at home. Note that they do report prior increased ability to verbalize, but has not been demonstrating for past 2 years. Indications for SGD include ability to manipulate technology devices such as cell phones and iPad, demonstrated interest in communication device, ability to match visually depicted objects, and natural communication not meeting communication needs of expressing wants, needs, thoughts, and ideas. When presented, pt orients to device but declines to select any buttons this date without hand over hand prompting from father. SLP provides modeling throughout activities completed this date. Pt declines to participate in presented activities despite modeling and cues from SLP. No joint attention evidenced. X1 instance of imitation following direct model from father. No response to SLP or father's questions via verbalization or SGD.   06-19-22: SLP provided education on potential resources which  may A family in meeting Judd's social emotional needs. Ongoing education regarding Tijon having communication skills (per family report) yet not employing them which appears to be correlated with reduction in social interactions. SLP generated low tech AAC board for basic needs and leisure activities, using [place] text + picture. Training on how to implement into routine to aid in communication efficacy on behalf of patient. Pt mother able to demonstrate use of low tech AAC with pt x2 with rare mod-I. SLP demonstrated speech generating device with pt appearing interested. Education on potential indications being Dereke not currently meeting communication needs with natural communication, being physically and cognitively able to navigate tech devices, and demonstrated interest in device presented. Benefits of SGD are autonomy of communication, verbal model in which pt can imitate, high degree of customization, potentially  motivating for pt to employ.   06/12/2022: Focus of session is education for parents (primary caregivers) regarding importance of social engagement and cognitively stimulating and socially engaging, appropriate opportunities for Ehaan to encourage ongoing use of language, as mother reports Hamzah's verbal language has decreased concurrently with decrease in interactions with graduation from school and sisters leaving home. Barrier is transportation, as pt is home with Mother throughout the week and she does not drive. SLP modeled pt first language throughout session, primarily rote phrases, which resulted in pt verbalizing "go" and "my turn" 1x each. Education on how parents can incorporate at home. Encouraged family to carve out short periods of time throughout the day in which activities can be Durward-centered or engaging him in tasks such as cooking which he enjoys. Parents verbalize understanding of all information presented.   PATIENT/FAMILY EDUCATION: Education details: See above Person educated: Patient, Parent, and sister Education method: Explanation Education comprehension: family verbalized understanding and ? Comprehension by pt   GOALS: Goals reviewed with patient family? Yes  SHORT TERM GOALS: Target date: 08/01/22  Pt will hold eye contact (even fleeting) with speaker 25% of the time, in 3 sessions Baseline: 5% Goal status: INITIAL  2.  Pt will answer simple questions such as "what is your name?" "What does a (animal) say?" 15% of the time with usual min A, in 2 sessions Baseline: 0% Goal status: INITIAL  3.  Pt will engage in socially appropriate interactive behavior of "giving five" or "fist bump" with occasional min-mod A in 40% of opportunities over 3 sessions Baseline: 40% with usual mod A Goal status: INITIAL   LONG TERM GOALS: Target date: 09/01/22  Pt will hold eye contact (even fleeting) with speaker 35% of the time, in 3 sessions Baseline: 5% Goal status:  INITIAL  2.  Pt will answer simple questions such as "what is your name?", etc with usual min A 20% of the time, in 2 sessions Baseline: 0% Goal status: INITIAL  3.  Pt will engage in socially appropriate interactive behavior of "giving five" or "fist bump" with occasional min A in 50% of opportunities over 3 sessions Baseline: 40% usual mod A Goal status: INITIAL   ASSESSMENT:  CLINICAL IMPRESSION: Patient is a 21 y.o. male who was seen today for informal assessment mostly via interview. Li exhibits profound expressive language disorder and likely a severe or profound receptive language disorder. Pt produced pragmatically inappropriate behaviors today (which may or may not be due to anxiety in new situations) of getting up out of his chair intermittently x4 throughout eval, clicking tongue, grinding teeth, rubbing hands, rubbing his face and head, and banging palms together. He did not  hold eye contact with speaker talking to him 95% of the time. Prior to 2020, pt reportedly responded appropriately to simple "wh" questions, told family, "I want... (simple food or drink)", and would rarely/occasionally answer yes/no questions with answers associated with high interest items (e.g., family: "Do you want to watch sponge bob?"  pt: "Yeah, sponge bob."). Pt would reportedly also respond in repetitive verbal responses. Example: Sister asked "Am I pretty or ugly?" And pt respond, "Ugly!" And laugh with sister. Pt now engages in very little/no verbal expression and social interaction at this time. Since graduation he has been at home with mother during the day without any other regular social interaction. SLP strongly recommended family seek out regular social interaction (I.e, a social interaction group, or a day program) for [place] as this is a crucial part of remaining more verbal/socially interactive.  OBJECTIVE IMPAIRMENTS: include attention, memory, awareness, executive functioning, expressive  language, and receptive language. These impairments are limiting patient from ADLs/IADLs and effectively communicating at home and in community. Factors affecting potential to achieve goals and functional outcome are ability to learn/carryover information, co-morbidities, cooperation/participation level, medical prognosis, previous level of function, and severity of impairments. Patient will benefit from skilled SLP services to address above impairments and improve overall function.  REHAB POTENTIAL: Fair given medical dx  PLAN:  SLP FREQUENCY: 1x/week  SLP DURATION: 8 weeks  PLANNED INTERVENTIONS: Language facilitation, Environmental controls, Cueing hierachy, Internal/external aids, Functional tasks, SLP instruction and feedback, Compensatory strategies, and Patient/family education    Su Monks, [place] 06/26/2022, 2:06 PM

## 2022-07-03 ENCOUNTER — Ambulatory Visit: Payer: Medicaid Other | Admitting: Speech Pathology

## 2022-07-03 ENCOUNTER — Ambulatory Visit: Payer: Medicaid Other | Admitting: Occupational Therapy

## 2022-07-08 NOTE — Therapy (Signed)
OUTPATIENT OCCUPATIONAL THERAPY EVALUATION  Patient Name: Brandon Bennett MRN: [id number] DOB:06-17-2001, 21 y.o., male Today's Date: 07/09/2022  PCP: Littie Deeds, MD  REFERRING PROVIDER: Littie Deeds, MD   END OF SESSION:  OT End of Session - 07/09/22 1511     Visit Number 2    Number of Visits 9    Date for OT Re-Evaluation 08/14/22    Authorization Type Medicaid [place]    Authorization - Visit Number 2    Authorization - Number of Visits 9    OT Start Time 1449    OT Stop Time 1529    OT Time Calculation (min) 40 min    Activity Tolerance Patient tolerated treatment well    Behavior During Therapy [facility] for tasks assessed/performed              Past Medical History:  Diagnosis Date   Down syndrome    History reviewed. No pertinent surgical history. 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 She is excited to try communication board.   Pt accompanied by:  sister    PERTINENT HISTORY: Pt dx with Down's Sydrome. Had ST, OT, and PT all through school. Graduated in 2021. Verbal expression has decr'd since COVID and pt rarely communicates in words at this time with ADL decline.   PRECAUTIONS: Other: pt is mostly non-verbal  WEIGHT BEARING RESTRICTIONS: No  PAIN:   Are you having pain? No  FALLS: Has patient fallen in last 6 months? No  LIVING ENVIRONMENT: Lives with: lives with their family and with mother and father Lives in: House/apartment Stairs: Yes: Internal: 2 steps; none Has following equipment at home: None  PLOF: Needs assistance with ADLs  PATIENT GOALS: Mom would like for pt to be more engaging and complete ADLs at PLOF.   OBJECTIVE:   HAND DOMINANCE: Right  ADLs: Overall ADLs: mother has to initiate since COVID with no more than 25% assistance from mother; she had been able to set clothes out for patient but now has to physically assist pt IADLs:  Prior to COVID, he put away clean laundry  FUNCTIONAL OUTCOME MEASURES:    BUE ROM and MMT : WLF  COORDINATION: Was able to write name when he was in school.  Has not recently attempted.  COGNITION: Overall cognitive status: History of cognitive impairments - at baseline and see ST eval for further details.   OBSERVATIONS: Pt demonstrating clicking tongue, grinding teeth, rubbing hands, rubbing his face and head, and banging palms together throughout session.   TODAY'S TREATMENT:                                                                                                                               -  Self-care/home management completed for duration as noted below including:  OT initiated use of communication board with sister, pt, and ST present. Additional options provided, which caregiver was encouraged to laminate and place velcro on back to stick to clipboard to provide 2 choices at a time. Pt was encouraged to follow a sequential task list for morning and bedtime routines with choices during the day.  Would try timed voiding every 2 hours and adjust as needed to avoid incontinence as pt does not seem to have internal cues as needed to alert caregivers when he needs to void bowel or bladder.  Also recommended 5 minute warning with use of timer to help transition  from tasks such as tv, tablet, and phone time. ST suggesting countdown timer for additional visual cueing due to concerns with insight to time.   PATIENT EDUCATION: Education details: POC Person educated: Parent Education method: Explanation Education comprehension: verbalized understanding and needs further education  HOME EXERCISE PROGRAM: Assessment only  GOALS: Goals reviewed with parent? Yes  SHORT TERM GOALS: Target date: 07/17/2022   Caregiver will demonstrate appropriate use of communication board.   Baseline: does not utilize Goal status: INITIAL  2.  Patient's mother will report at least 1-point increase in average PSFS score or at least 2-point increase in a single activity score.  Baseline: PSFS score =1.3; see above for full details Goal status: INITIAL    LONG TERM GOALS: Target date: 08/14/2022    Patient's mother will report at least two-point increase in average PSFS score or at least three-point increase in a single activity score indicating functionally significant improvement given minimum detectable change.  Baseline: PSFS score =1.3; see above for full details Goal status: INITIAL  2.  Pt will utilize communication board at home on a daily basis.  Baseline: does not utilize Goal status: INITIAL   ASSESSMENT:  CLINICAL IMPRESSION: Pt to benefit from skilled OT services in the outpatient setting to encourage more active engagement in ADLs and IADLs at [facility] as able.    PERFORMANCE DEFICITS: in functional skills including ADLs, IADLs, and UE functional use  IMPAIRMENTS: are limiting patient from ADLs and play.   COMORBIDITIES: has co-morbidities such as inappropriate behavior and history of limited engagement/ability to follow commands  that affects occupational performance. Patient will benefit from skilled OT to address above impairments and improve overall function.  REHAB POTENTIAL: Fair given behavioral and cognitive impairments   PLAN:  OT  FREQUENCY: 1x/week  OT DURATION: 8 weeks  PLANNED INTERVENTIONS: self care/ADL training, therapeutic exercise, therapeutic activity, patient/family education, cognitive remediation/compensation, coping strategies training, DME and/or AE instructions, and Re-evaluation  RECOMMENDED OTHER SERVICES: none at this time  CONSULTED AND AGREED WITH PLAN OF CARE: family member/caregiver  PLAN FOR NEXT SESSION: review use of communication board.    Delana Meyer, OT 07/09/2022, 3:13 PM

## 2022-07-09 ENCOUNTER — Ambulatory Visit: Payer: Medicaid Other | Admitting: Occupational Therapy

## 2022-07-09 DIAGNOSIS — F802 Mixed receptive-expressive language disorder: Secondary | ICD-10-CM | POA: Diagnosis not present

## 2022-07-09 DIAGNOSIS — Q909 Down syndrome, unspecified: Secondary | ICD-10-CM

## 2022-07-09 DIAGNOSIS — Z789 Other specified health status: Secondary | ICD-10-CM

## 2022-07-10 ENCOUNTER — Encounter: Payer: Self-pay | Admitting: Occupational Therapy

## 2022-07-10 ENCOUNTER — Encounter: Payer: Medicaid Other | Admitting: Speech Pathology

## 2022-07-31 ENCOUNTER — Ambulatory Visit: Payer: Medicaid Other | Admitting: Occupational Therapy

## 2022-07-31 ENCOUNTER — Ambulatory Visit: Payer: Medicaid Other | Admitting: Speech Pathology

## 2022-08-07 ENCOUNTER — Ambulatory Visit: Payer: Medicaid Other | Admitting: Speech Pathology

## 2022-08-07 ENCOUNTER — Ambulatory Visit: Payer: Medicaid Other | Admitting: Occupational Therapy

## 2022-08-14 ENCOUNTER — Ambulatory Visit: Payer: Medicaid Other | Admitting: Speech Pathology

## 2022-08-14 ENCOUNTER — Encounter: Payer: Self-pay | Admitting: Occupational Therapy

## 2022-08-14 ENCOUNTER — Ambulatory Visit: Payer: Medicaid Other | Attending: Family Medicine | Admitting: Occupational Therapy

## 2022-08-14 DIAGNOSIS — F802 Mixed receptive-expressive language disorder: Secondary | ICD-10-CM | POA: Insufficient documentation

## 2022-08-14 DIAGNOSIS — Z789 Other specified health status: Secondary | ICD-10-CM | POA: Insufficient documentation

## 2022-08-14 DIAGNOSIS — Q909 Down syndrome, unspecified: Secondary | ICD-10-CM | POA: Insufficient documentation

## 2022-08-14 DIAGNOSIS — F801 Expressive language disorder: Secondary | ICD-10-CM | POA: Insufficient documentation

## 2022-08-14 NOTE — Therapy (Deleted)
OUTPATIENT SPEECH LANGUAGE PATHOLOGY TREATMENT   Patient Name: Brandon Bennett MRN: [id number] DOB:09-Apr-2001, 22 y.o., male Today's Date: 08/14/2022  PCP: Brandon Mocha, MD REFERRING PROVIDER: same  END OF SESSION:  End of Session - 08/14/22 1402     Visit Number 5    Number of Visits 9    Date for SLP Re-Evaluation 09/01/22    Authorization Type medicaid [place] access    SLP Start Time 1401    SLP Stop Time  1445    SLP Time Calculation (min) 44 min    Activity Tolerance Patient tolerated treatment well              Past Medical History:  Diagnosis Date   Down syndrome    No past surgical history on file. 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  PAIN:  Are you having pain?  Pt did not indicate any pain  OBJECTIVE:  PATIENT REPORTED OUTCOME MEASURES (PROM): To be completed during first 1-2 sessions by sister and/or mother   TODAY'S TREATMENT:                                                                                                                                         08-14-22: Pt accompanied by mother this date. Pt's mother reported not having opportunity to look into resources provided by SLP during prior session. Family is using communication boards at home. Mother stated they have not been "super helpful", but when pt is  interested in communicating he will occasionally attempt to use them. No reported changes in pt's communicative abilities since last session. SLP provided education on importance and benefit of community engagement for pt.  Pt loves cats.   06-26-22: SLP introduced pt to Brandon Bennett! To probe for suitability for implementation of speech generating device (SGD) high tech AAC option. Pt has evidenced little verbalizations within therapy sessions and patient's family members endorse little spontaneous speech at home. Note that they do report prior increased ability to verbalize, but has not been demonstrating for past 2 years. Indications for SGD include ability to manipulate technology devices such as cell phones and iPad, demonstrated interest in communication device, ability to match visually depicted objects, and natural communication not meeting communication needs of expressing wants, needs, thoughts, and ideas. When presented, pt orients to  device but declines to select any buttons this date without hand over hand prompting from father. SLP provides modeling throughout activities completed this date. Pt declines to participate in presented activities despite modeling and cues from SLP. No joint attention evidenced. X1 instance of imitation following direct model from father. No response to SLP or father's questions via verbalization or SGD.   06-19-22: SLP provided education on potential resources which may A family in meeting Brandon Bennett's social emotional needs. Ongoing education regarding Brandon Bennett having communication skills (per family report) yet not employing them which appears to be correlated with reduction in social interactions. SLP generated low tech AAC board for basic needs and leisure activities, using Brandon Bennett text + picture. Training on how to implement into routine to aid in communication efficacy on behalf of patient. Pt mother able to demonstrate use of low tech AAC with pt x2 with rare mod-I.  SLP demonstrated speech generating device with pt appearing interested. Education on potential indications being Brandon Bennett not currently meeting communication needs with natural communication, being physically and cognitively able to navigate tech devices, and demonstrated interest in device presented. Benefits of SGD are autonomy of communication, verbal model in which pt can imitate, high degree of customization, potentially motivating for pt to employ.   06/12/2022: Focus of session is education for parents (primary caregivers) regarding importance of social engagement and cognitively stimulating and socially engaging, appropriate opportunities for Brandon Bennett to encourage ongoing use of language, as mother reports Brandon Bennett's verbal language has decreased concurrently with decrease in interactions with graduation from school and sisters leaving home. Barrier is transportation, as pt is home with Mother throughout the week and she does not drive. SLP modeled pt first language throughout session, primarily rote phrases, which resulted in pt verbalizing "go" and "my turn" 1x each. Education on how parents can incorporate at home. Encouraged family to carve out short periods of time throughout the day in which activities can be Brandon Bennett-centered or engaging him in tasks such as cooking which he enjoys. Parents verbalize understanding of all information presented.   PATIENT/FAMILY EDUCATION: Education details: See above Person educated: Patient, Parent, and sister Education method: Explanation Education comprehension: family verbalized understanding and ? Comprehension by pt   GOALS: Goals reviewed with patient family? Yes  SHORT TERM GOALS: Target date: 08/01/22  Pt will hold eye contact (even fleeting) with speaker 25% of the time, in 3 sessions Baseline: 5% Goal status: INITIAL  2.  Pt will answer simple questions such as "what is your name?" "What does a (animal) say?" 15% of the time with usual min A, in 2  sessions Baseline: 0% Goal status: INITIAL  3.  Pt will engage in socially appropriate interactive behavior of "giving five" or "fist bump" with occasional min-mod A in 40% of opportunities over 3 sessions Baseline: 40% with usual mod A Goal status: INITIAL   LONG TERM GOALS: Target date: 09/01/22  Pt will hold eye contact (even fleeting) with speaker 35% of the time, in 3 sessions Baseline: 5% Goal status: INITIAL  2.  Pt will answer simple questions such as "what is your name?", etc with usual min A 20% of the time, in 2 sessions Baseline: 0% Goal status: INITIAL  3.  Pt will engage in socially appropriate interactive behavior of "giving five" or "fist bump" with occasional min A in 50% of opportunities over 3 sessions Baseline: 40% usual mod A Goal status: INITIAL   ASSESSMENT:  CLINICAL IMPRESSION: Patient is a 22 y.o. male who  was seen today for informal assessment mostly via interview. [place] exhibits profound expressive language disorder and likely a severe or profound receptive language disorder. Pt produced pragmatically inappropriate behaviors today (which may or may not be due to anxiety in new situations) of getting up out of his chair intermittently x4 throughout eval, clicking tongue, grinding teeth, rubbing hands, rubbing his face and head, and banging palms together. He did not hold eye contact with speaker talking to him 95% of the time. Prior to 2020, pt reportedly responded appropriately to simple "wh" questions, told family, "I want... (simple food or drink)", and would rarely/occasionally answer yes/no questions with answers associated with high interest items (e.g., family: "Do you want to watch sponge bob?"  pt: "Yeah, sponge bob."). Pt would reportedly also respond in repetitive verbal responses. Example: Sister asked "Am I pretty or ugly?" And pt respond, "Ugly!" And laugh with sister. Pt now engages in very little/no verbal expression and social interaction at this  time. Since graduation he has been at home with mother during the day without any other regular social interaction. SLP strongly recommended family seek out regular social interaction (I.e, a social interaction group, or a day program) for [place] as this is a crucial part of remaining more verbal/socially interactive.  OBJECTIVE IMPAIRMENTS: include attention, memory, awareness, executive functioning, expressive language, and receptive language. These impairments are limiting patient from ADLs/IADLs and effectively communicating at home and in community. Factors affecting potential to achieve goals and functional outcome are ability to learn/carryover information, co-morbidities, cooperation/participation level, medical prognosis, previous level of function, and severity of impairments. Patient will benefit from skilled SLP services to address above impairments and improve overall function.  REHAB POTENTIAL: Fair given medical dx  PLAN:  SLP FREQUENCY: 1x/week  SLP DURATION: 8 weeks  PLANNED INTERVENTIONS: Language facilitation, Environmental controls, Cueing hierachy, Internal/external aids, Functional tasks, SLP instruction and feedback, Compensatory strategies, and Patient/family education    Leroy Libman, [place] 08/14/2022, 2:03 PM

## 2022-08-14 NOTE — Patient Instructions (Addendum)
Reach out to Dyke Brackett at [place]: [phone]  [email or web address]   [email or web address]   [email or web address]   [email or web address]   [email or web address]   [email or web address]

## 2022-08-14 NOTE — Therapy (Signed)
OUTPATIENT OCCUPATIONAL THERAPY EVALUATION  Patient Name: Brandon Bennett MRN: [id number] DOB:March 10, 2001, 22 y.o., male Today's Date: 08/14/2022  PCP: Zola Button, MD  REFERRING PROVIDER: Zola Button, MD   END OF SESSION:  OT End of Session - 08/14/22 1437     Visit Number 3    Number of Visits 9    Date for OT Re-Evaluation 08/14/22    Authorization Type Medicaid [place]    Authorization - Visit Number 3    Authorization - Number of Visits 9    OT Start Time 1320    OT Stop Time 1400    OT Time Calculation (min) 40 min    Activity Tolerance Patient tolerated treatment well    Behavior During Therapy [facility] for tasks assessed/performed              Past Medical History:  Diagnosis Date   Down syndrome    History reviewed. No pertinent surgical history. 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 He had some success with use of a communication board while staying with his sisters but then fell back into same routines and limited communication as before. Mother also reports she has been verbally cueing pt when it is time to change his clothes and has not used a communication board to communicate when it is time to complete this ADL.    Pt accompanied by:  mother  - Nujushar    PERTINENT HISTORY: Pt dx with Down's Sydrome. Had ST, OT, and PT all  through school. Graduated in 2021. Verbal expression has decr'd since COVID and pt rarely communicates in words at this time with ADL decline.   PRECAUTIONS: Other: pt is mostly non-verbal  WEIGHT BEARING RESTRICTIONS: No  PAIN:  Are you having pain? No  FALLS: Has patient fallen in last 6 months? No  LIVING ENVIRONMENT: Lives with: lives with their family and with mother and father Lives in: House/apartment Stairs: Yes: Internal: 2 steps; none Has following equipment at home: None  PLOF: Needs assistance with ADLs  PATIENT GOALS: Mom would like for pt to be more engaging and complete ADLs at PLOF.   OBJECTIVE:   HAND DOMINANCE: Right  ADLs: Overall ADLs: mother has to initiate since COVID with no more than 25% assistance from mother; she had been able to set clothes out for patient but now has to physically assist pt IADLs:  Prior to [place], he put away clean laundry  FUNCTIONAL OUTCOME MEASURES:    BUE ROM and MMT : WLF  COORDINATION: Was able to write name when he was in school.  Has not recently attempted.  COGNITION: Overall cognitive status: History of cognitive impairments - at baseline and see ST eval for further details.   OBSERVATIONS: Pt demonstrating clicking tongue, grinding teeth, rubbing hands, rubbing his face and head, and banging palms together throughout session.   TODAY'S TREATMENT:                                                                                                                               -  Self-care/home management completed for duration as noted below including: OT reviewed use of communication board with mother and pt. reviewed timed voiding every 2 hours and adjust as needed to avoid incontinence as pt does not seem to have internal cues as needed to alert caregivers when he needs to void bowel or bladder.  Also recommended use of guided access with phone or iPad as well as timer to help transition from tasks. Reviewed use of  countdown timer additionally for visual cueing due to concerns with insight to time and to help establish new daily routines  - Therapeutic activities completed for duration as noted below including:  OT used iPad to use guided access, guided access timer, and selection of screen pt was not allowed to utilize I.e. where pop up adds appeared. Pt demonstrated interest in activity and willingness to complete 36 piece puzzle of cat though discussed pt might be more engaged with smaller piece puzzle. PATIENT EDUCATION: Education details: use of Data processing manager; visual timer, iPad use. Person educated: Parent Education method: Dance movement psychotherapist comprehension: verbalized understanding, returned demonstration, and needs further education  HOME EXERCISE PROGRAM: N/A  GOALS: Goals reviewed with parent? Yes  SHORT TERM GOALS: Target date: 07/17/2022   Caregiver will demonstrate appropriate use of communication board.   Baseline: does not utilize Goal status: INITIAL  2.  Patient's mother will report at least 1-point increase in average PSFS score or at least 2-point increase in a single activity score.  Baseline: PSFS score =1.3; see above for full details Goal status: INITIAL    LONG TERM GOALS: Target date: 08/14/2022    Patient's mother will report at least two-point increase in average PSFS score or at least three-point increase in a single activity score indicating functionally significant improvement given minimum detectable change.  Baseline: PSFS score =1.3; see above for full details Goal status: INITIAL  2.  Pt will utilize communication board at home on a daily basis.  Baseline: does not utilize Goal status: INITIAL   ASSESSMENT:  CLINICAL IMPRESSION: Pt to benefit from skilled OT services in the outpatient setting to encourage more active engagement in ADLs and IADLs at [facility] as able. Limited progression towards goals due to lack of carry-over and  duration between therapy visits; however, pt and caregivers seem motivated to continue efforts.    PERFORMANCE DEFICITS: in functional skills including ADLs, IADLs, and UE functional use  IMPAIRMENTS: are limiting patient from ADLs and play.   COMORBIDITIES: has co-morbidities such as inappropriate behavior and history of limited engagement/ability to follow commands  that affects occupational performance. Patient will benefit from skilled OT to address above impairments and improve overall function.  REHAB POTENTIAL: Fair given behavioral and cognitive impairments   PLAN:  OT FREQUENCY: 1x/week  OT DURATION: 8 weeks  PLANNED INTERVENTIONS: self care/ADL training, therapeutic exercise, therapeutic activity, patient/family education, cognitive remediation/compensation, coping strategies training, DME and/or AE instructions, and Re-evaluation  RECOMMENDED OTHER SERVICES: none at this time  CONSULTED AND AGREED WITH PLAN OF CARE: family member/caregiver  PLAN FOR NEXT SESSION: review use of communication board; puzzle on ipad with guided access and guided access timer.  Dennis Bast, OT 08/14/2022, 2:40 PM

## 2022-08-14 NOTE — Therapy (Signed)
[facility] [address] [place], [place], [address] Phone: [phone]   Fax:  [phone]  Patient Details  Name: Brandon Bennett MRN: [id number] Date of Birth: October 04, 2000 Referring Provider:  Zola Button, MD  Encounter Date: 08/14/2022  Pt accompanied by mother this date. Pt's mother reported not having opportunity to look into resources provided by SLP during prior session. Mother stated previously provided low tech AAC options have not been "super helpful", but when pt is interested in communicating he will occasionally attempt to use them. No reported changes in pt's communicative abilities since last session. SLP provided education on importance and benefit of community engagement for pt. Discussion that this venue appears to be not facilitative to meet their needs, with mother agreeing. As such, no charge visit this session and d/c pt from services. Provided resources for social engagement and alternative SLP services.   SPEECH THERAPY DISCHARGE SUMMARY  Visits from Start of Care: 3  Current functional level related to goals / functional outcomes: profound expressive language disorder and likely a severe or profound receptive language disorder with limited interactions evidenced during ST and reported at home. Decreased social interactions likely contributing to communicative decline.    Remaining deficits: Expressive/receptive language impairments   Education / Equipment: Low tech AA, potential resources    Patient agrees to discharge. Patient goals were not met. Patient is being discharged due to maximized rehab potential.   Su Monks, [place] 08/14/2022, 2:21 PM  [facility] [address] [place], [place], [address] Phone: [phone]   Fax:  [phone]

## 2022-08-21 ENCOUNTER — Ambulatory Visit: Payer: Medicaid Other | Admitting: Occupational Therapy

## 2022-08-21 ENCOUNTER — Ambulatory Visit: Payer: Medicaid Other | Admitting: Speech Pathology

## 2022-08-21 ENCOUNTER — Encounter: Payer: Self-pay | Admitting: Occupational Therapy

## 2022-08-21 DIAGNOSIS — Q909 Down syndrome, unspecified: Secondary | ICD-10-CM

## 2022-08-21 DIAGNOSIS — Z789 Other specified health status: Secondary | ICD-10-CM | POA: Diagnosis not present

## 2022-08-21 DIAGNOSIS — F801 Expressive language disorder: Secondary | ICD-10-CM

## 2022-08-21 NOTE — Therapy (Signed)
OUTPATIENT OCCUPATIONAL THERAPY EVALUATION  Patient Name: Brandon Bennett MRN: [id number] DOB:Mar 02, 2001, 22 y.o., male Today's Date: 08/21/2022  PCP: Zola Button, MD  REFERRING PROVIDER: Zola Button, MD   END OF SESSION:  OT End of Session - 08/21/22 1435     Visit Number 4    Number of Visits 9    Date for OT Re-Evaluation 08/14/22    Authorization Type Medicaid [place]    Authorization - Visit Number 4    Authorization - Number of Visits 9    OT Start Time [id number]   pt and family arrived 9 min late to scheduled appointment time.   OT Stop Time 1406    OT Time Calculation (min) 38 min    Activity Tolerance Patient tolerated treatment well    Behavior During Therapy WFL for tasks assessed/performed              Past Medical History:  Diagnosis Date   Down syndrome    History reviewed. No pertinent surgical history. 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 He had some success with use of a communication board while staying with his sisters but then fell back into same routines and limited communication as before. Mother also reports she has been verbally cueing pt when it is time to change his clothes and has not used a communication board to communicate when it is time to complete this ADL.    Pt accompanied  by:  mother  - Nujushar  and the pt's father Dossie Der  PERTINENT HISTORY: Pt dx with Down's Sydrome. Had ST, OT, and PT all through school. Graduated in 2021. Verbal expression has decr'd since COVID and pt rarely communicates in words at this time with ADL decline.   PRECAUTIONS: Other: pt is mostly non-verbal  WEIGHT BEARING RESTRICTIONS: No  PAIN:  Are you having pain? No  FALLS: Has patient fallen in last 6 months? No  LIVING ENVIRONMENT: Lives with: lives with their family and with mother and father Lives in: House/apartment Stairs: Yes: Internal: 2 steps; none Has following equipment at home: None  PLOF: Needs assistance with ADLs  PATIENT GOALS: Mom would like for pt to be more engaging and complete ADLs at PLOF.  OBJECTIVE:   HAND DOMINANCE: Right  ADLs: Overall ADLs: mother has to initiate since COVID with no more than 25% assistance from mother; she had been able to set clothes out for patient but now has to physically assist pt IADLs:  Prior to [place], he put away clean laundry  FUNCTIONAL OUTCOME MEASURES:    BUE ROM and MMT : WLF  COORDINATION: Was able to write name when he was in school.  Has not recently attempted.  COGNITION: Overall cognitive status: History of cognitive impairments - at baseline and see ST eval for further details.   OBSERVATIONS: Pt demonstrating clicking tongue, grinding teeth, rubbing  hands, rubbing his face and head, and banging palms together throughout session.   TODAY'S TREATMENT:                                                                                                                               - Self-care/home management completed for duration as noted below including: OT reviewed use of communication board with mother, father, and pt. Reviewed use of visual clock for transitioning to and from tasks and occupations.   Reviewed use of guided access with phone or iPad as well as timer to help transition from tasks and  occupations.  Per subjective, pt's mother was encouraged to bring in communication board and when she is telling the pt it is time to get dressed, she is encouraged to initially help him begin the task vs waiting 15 minutes to see if he initiates the task first.   - Therapeutic activities completed for duration as noted below including: OT used iPad with guided access and guided access timer. Pt volitionally picked up iPad, selected i[employer] radio app, and started playing and listening to music. Pt also participated in two, 4 piece puzzles of cat  and dinosaur with intermittent hand over hand assistance, increased time, and encouragement to participate in activity.  PATIENT EDUCATION: Education details: use of Data processing manager; visual timer, iPad use. Person educated: Parents Education method: Customer service manager Education comprehension: verbalized understanding, returned demonstration, and needs further education  HOME EXERCISE PROGRAM: N/A  GOALS: Goals reviewed with parent? Yes  SHORT TERM GOALS: Target date: 07/17/2022   Caregiver will demonstrate appropriate use of communication board.   Baseline: does not utilize Goal status: INITIAL  2.  Patient's mother will report at least 1-point increase in average PSFS score or at least 2-point increase in a single activity score.  Baseline: PSFS score =1.3; see above for full details Goal status: INITIAL    LONG TERM GOALS: Target date: 08/14/2022    Patient's mother will report at least two-point increase in average PSFS score or at least three-point increase in a single activity score indicating functionally significant improvement given minimum detectable change.  Baseline: PSFS score =1.3; see above for full details Goal status: INITIAL  2.  Pt will utilize communication board at home on a daily basis.  Baseline: does not utilize Goal status: INITIAL   ASSESSMENT:  CLINICAL IMPRESSION: Pt to benefit from skilled  OT services in the outpatient setting to encourage more active engagement in ADLs and IADLs at [facility] as able. Pt and caregivers remain motivated to continue efforts. Evidence of more carry-over and patient participation.   PERFORMANCE DEFICITS: in functional skills including ADLs, IADLs, and UE functional use  IMPAIRMENTS: are limiting patient from ADLs and play.   COMORBIDITIES: has co-morbidities such as inappropriate behavior and history of limited engagement/ability to follow commands  that affects occupational performance. Patient will benefit from skilled OT to address above impairments and improve overall function.  REHAB POTENTIAL: Fair given behavioral and cognitive impairments   PLAN:  OT FREQUENCY: 1x/week  OT DURATION: 8 weeks  PLANNED INTERVENTIONS: self care/ADL training, therapeutic exercise, therapeutic activity, patient/family education, cognitive remediation/compensation, coping strategies training, DME and/or AE instructions, and Re-evaluation  RECOMMENDED OTHER SERVICES: none at this time  CONSULTED AND AGREED WITH PLAN OF CARE: family member/caregiver  PLAN FOR NEXT SESSION: review use of communication board; puzzle on ipad with guided access and guided access timer.  Dennis Bast, OT 08/21/2022, 2:38 PM

## 2022-08-28 ENCOUNTER — Ambulatory Visit: Payer: Medicaid Other | Admitting: Occupational Therapy

## 2022-08-28 ENCOUNTER — Encounter: Payer: Medicaid Other | Admitting: Speech Pathology

## 2022-08-28 DIAGNOSIS — Z789 Other specified health status: Secondary | ICD-10-CM

## 2022-08-28 DIAGNOSIS — Q909 Down syndrome, unspecified: Secondary | ICD-10-CM

## 2022-08-28 DIAGNOSIS — F801 Expressive language disorder: Secondary | ICD-10-CM

## 2022-08-28 DIAGNOSIS — F802 Mixed receptive-expressive language disorder: Secondary | ICD-10-CM

## 2022-08-28 NOTE — Therapy (Unsigned)
OUTPATIENT OCCUPATIONAL THERAPY TREATMENT  Patient Name: Brandon Bennett MRN: [id number] DOB:[birth year], 22 y.o., male Today's Date: 08/28/2022  PCP: Zola Button, MD  REFERRING PROVIDER: Zola Button, MD   END OF SESSION:  OT End of Session - 08/28/22 1006       Visit Number 5     Number of Visits 9     Date for OT Re-Evaluation 09/25/22     Authorization Type Medicaid [place]     Authorization - Visit Number 5    Authorization - Number of Visits 9     OT Start Time [id number]   pt and family arrived 45 min late to scheduled appointment time.    OT Stop Time 1403     OT Time Calculation (min) 31 min     Activity Tolerance Patient tolerated treatment well     Behavior During Therapy WFL for tasks assessed/performed      Past Medical History:  Diagnosis Date   Down syndrome    No past surgical history on file. 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  Rationale for Evaluation and Treatment: Rehabilitation  SUBJECTIVE:   SUBJECTIVE STATEMENT: The pt's father reports the pt's sister had to work and was unable to attend the session today but will try to FaceTime with him if she is able so she can talk with the therapist. Father asks what a communication board is.   Pt accompanied by:  mother  - Nujushar  and the pt's father Dossie Der  PERTINENT HISTORY: Pt dx with Down's Sydrome. Had ST, OT, and PT all through school. Graduated in 2021. Verbal expression has decr'd since COVID and pt rarely communicates in words at this time with ADL decline.    PRECAUTIONS: Other: pt is mostly non-verbal  WEIGHT BEARING RESTRICTIONS: No  PAIN:  Are you having pain? No  FALLS: Has patient fallen in last 6 months? No  LIVING ENVIRONMENT: Lives with: lives with their family and with mother and father Lives in: House/apartment Stairs: Yes: Internal: 2 steps; none Has following equipment at home: None  PLOF: Needs assistance with ADLs  PATIENT GOALS: Mom would like for pt to be more engaging and complete ADLs at PLOF.  OBJECTIVE:   HAND DOMINANCE: Right  ADLs: Overall ADLs: mother has to initiate since COVID with no more than 25% assistance from mother; she had been able to set clothes out for patient but now has to physically assist pt IADLs:  Prior to [place], he put away clean laundry  FUNCTIONAL OUTCOME MEASURES:    BUE ROM and MMT : WLF  COORDINATION: Was able to write name when he was in school.  Has not recently attempted.  COGNITION: Overall cognitive status: History of cognitive impairments - at baseline and see ST eval for further details.   OBSERVATIONS: Pt demonstrating clicking tongue, grinding teeth, rubbing hands, rubbing his face and head, and banging palms together throughout session.   TODAY'S TREATMENT:                                                                                                                               -  Self-care/home management completed for duration as noted below including: OT reviewed use of communication board with sister (via [place]), father, and pt. Reviewed use of visual clock for transitioning to and from tasks and occupations.   Reviewed use of guided access with phone or iPad as well as timer to help transition from tasks and occupations.   - Therapeutic activities completed for duration as noted below including: OT used iPad with guided access and guided access timer. Pt participated in 4 piece puzzles of dog with intermittent hand over hand assistance, increased time,  encouragement to participate in activity, and use of stylus to encourage grasp/use of writing utensil.  PATIENT EDUCATION: Education details: use of Data processing manager; visual timer, iPad use. Person educated: Parents Education method: Customer service manager Education comprehension: verbalized understanding, returned demonstration, and needs further education  HOME EXERCISE PROGRAM: N/A  GOALS: Goals reviewed with parent? Yes  SHORT TERM GOALS: Target date: 07/17/2022   Caregiver will demonstrate appropriate use of communication board.   Baseline: does not utilize Goal status: INITIAL  2.  Patient's mother will report at least 1-point increase in average PSFS score or at least 2-point increase in a single activity score.  Baseline: PSFS score =1.3; see above for full details Goal status: INITIAL    LONG TERM GOALS: Target date: 08/14/2022    Patient's mother will report at least two-point increase in average PSFS score or at least three-point increase in a single activity score indicating functionally significant improvement given minimum detectable change.  Baseline: PSFS score =1.3; see above for full details Goal status: INITIAL  2.  Pt will utilize communication board at home on a daily basis.  Baseline: does not utilize Goal status: INITIAL   ASSESSMENT:  CLINICAL IMPRESSION: Pt to benefit from skilled OT services in the outpatient setting to encourage more active engagement in ADLs and IADLs at [facility] as able. Pt and caregivers remain motivated to continue efforts.   PERFORMANCE DEFICITS: in functional skills including ADLs, IADLs, and UE functional use  IMPAIRMENTS: are limiting patient from ADLs and play.   COMORBIDITIES: has co-morbidities such as inappropriate behavior and history of limited engagement/ability to follow commands  that affects occupational performance. Patient will benefit from skilled OT to address above impairments and improve overall  function.  REHAB POTENTIAL: Fair given behavioral and cognitive impairments   PLAN:  OT FREQUENCY: 1x/week  OT DURATION: 8 weeks  PLANNED INTERVENTIONS: self care/ADL training, therapeutic exercise, therapeutic activity, patient/family education, cognitive remediation/compensation, coping strategies training, DME and/or AE instructions, and Re-evaluation  RECOMMENDED OTHER SERVICES: none at this time  CONSULTED AND AGREED WITH PLAN OF CARE: family member/caregiver  PLAN FOR NEXT SESSION: review use of communication board; puzzle on ipad with guided access and guided access timer.  Dennis Bast, OT 08/28/2022, 9:47 AM

## 2022-09-01 ENCOUNTER — Encounter: Payer: Self-pay | Admitting: Occupational Therapy

## 2022-09-04 ENCOUNTER — Encounter: Payer: Medicaid Other | Admitting: Speech Pathology

## 2022-09-04 ENCOUNTER — Encounter: Payer: Self-pay | Admitting: Occupational Therapy

## 2022-09-04 ENCOUNTER — Ambulatory Visit: Payer: Medicaid Other | Admitting: Occupational Therapy

## 2022-09-04 DIAGNOSIS — Z789 Other specified health status: Secondary | ICD-10-CM

## 2022-09-04 DIAGNOSIS — F801 Expressive language disorder: Secondary | ICD-10-CM

## 2022-09-04 DIAGNOSIS — Q909 Down syndrome, unspecified: Secondary | ICD-10-CM

## 2022-09-04 NOTE — Therapy (Signed)
OUTPATIENT OCCUPATIONAL THERAPY TREATMENT  Patient Name: Brandon Bennett MRN: [id number] DOB:12-Dec-2000, 22 y.o., male Today's Date: 09/04/2022  PCP: Zola Button, MD  REFERRING PROVIDER: Zola Button, MD   END OF SESSION:  OT End of Session - 09/04/22 1321     Visit Number 6    Number of Visits 9    Date for OT Re-Evaluation 08/14/22    Authorization Type Medicaid [place]    Authorization - Visit Number 6    Authorization - Number of Visits 9    OT Start Time 1320    OT Stop Time 1358    OT Time Calculation (min) 38 min    Activity Tolerance Patient tolerated treatment well    Behavior During Therapy WFL for tasks assessed/performed            Past Medical History:  Diagnosis Date   Down syndrome    History reviewed. No pertinent surgical history. 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   Inappropriate behavior 06/05/2020   Insomnia 06/05/2020   Elevated liver enzymes 06/05/2020   Heart burn 02/12/2020   Hidradenitis suppurativa 06/16/2013   DOWN SYNDROME 09/08/2007    ONSET DATE: 05/15/2022 (date of referral)  REFERRING DIAG: Q90.9 (ICD-10-CM) - Down's syndrome   THERAPY DIAG:  Decreased activities of daily living (ADL)  DOWN SYNDROME  Expressive language disorder  Rationale for Evaluation and Treatment: Rehabilitation  SUBJECTIVE:   SUBJECTIVE STATEMENT: The pt's mother reports she has not noticed any changes with [place]'s behavior at home but has not been using the communication board.  Pt accompanied by:  mother  - Nujushar    PERTINENT HISTORY: Pt dx with Down's Sydrome. Had ST, OT, and PT all through school. Graduated in 2021. Verbal expression has decr'd since COVID and pt rarely communicates in words at this time with ADL decline.   PRECAUTIONS: Other: pt is mostly non-verbal  WEIGHT BEARING RESTRICTIONS: No  PAIN:  Are  you having pain? No  FALLS: Has patient fallen in last 6 months? No  LIVING ENVIRONMENT: Lives with: lives with their family and with mother and father Lives in: House/apartment Stairs: Yes: Internal: 2 steps; none Has following equipment at home: None  PLOF: Needs assistance with ADLs  PATIENT GOALS: Mom would like for pt to be more engaging and complete ADLs at PLOF.  OBJECTIVE:   HAND DOMINANCE: Right  ADLs: Overall ADLs: mother has to initiate since COVID with no more than 25% assistance from mother; she had been able to set clothes out for patient but now has to physically assist pt IADLs:  Prior to [place], he put away clean laundry  FUNCTIONAL OUTCOME MEASURES:    BUE ROM and MMT : WLF  COORDINATION: Was able to write name when he was in school.  Has not recently attempted.  COGNITION: Overall cognitive status: History of cognitive impairments - at baseline and see ST eval for further details.   OBSERVATIONS: Pt demonstrating clicking tongue, grinding teeth, rubbing hands, rubbing his face and head, and banging palms together throughout session.   TODAY'S TREATMENT:                                                                                                                               -  Self-care/home management completed for duration as noted below including: OT reviewed use of communication board with mother.  She brought in papers printed for patient and family beginning of therapy sessions.  One was placed in page protector and the other 1 was not.  Mother still unable to demonstrate verbalized use of communication board.  Occupational therapist placed additional page and page protector and placed on clipboard.  Fashioned activity selected for use.  Patient's mother verbalized understanding of use.  Reviewed use of guided access with iPad.  Mother unable to demonstrate use.  - Therapeutic activities completed for duration as noted below including: OT used iPad  with guided access and guided access timer. Pt participated in coloring app with encouragement to participate in activity, and use of stylus to encourage grasp/use of writing utensil.  PATIENT EDUCATION: Education details: use of Data processing manager; iPad use. Person educated: Parents Education method: Customer service manager Education comprehension: verbalized understanding, returned demonstration, and needs further education  HOME EXERCISE PROGRAM: N/A  GOALS: Goals reviewed with parent? Yes  SHORT TERM GOALS: Target date: 07/17/2022   Caregiver will demonstrate appropriate use of communication board.   Baseline: does not utilize Goal status: INITIAL  2.  Patient's mother will report at least 1-point increase in average PSFS score or at least 2-point increase in a single activity score.  Baseline: PSFS score =1.3; see above for full details Goal status: INITIAL    LONG TERM GOALS: Target date: 08/14/2022    Patient's mother will report at least two-point increase in average PSFS score or at least three-point increase in a single activity score indicating functionally significant improvement given minimum detectable change.  Baseline: PSFS score =1.3; see above for full details Goal status: INITIAL  2.  Pt will utilize communication board at home on a daily basis.  Baseline: does not utilize Goal status: INITIAL   ASSESSMENT:  CLINICAL IMPRESSION: Pt to benefit from skilled OT services in the outpatient setting to encourage more active engagement in ADLs and IADLs at [facility] as able. Pt and caregivers remain motivated to continue efforts.   PERFORMANCE DEFICITS: in functional skills including ADLs, IADLs, and UE functional use  IMPAIRMENTS: are limiting patient from ADLs and play.   COMORBIDITIES: has co-morbidities such as inappropriate behavior and history of limited engagement/ability to follow commands  that affects occupational performance. Patient will benefit  from skilled OT to address above impairments and improve overall function.  REHAB POTENTIAL: Fair given behavioral and cognitive impairments   PLAN:  OT FREQUENCY: 1x/week  OT DURATION: 8 weeks  PLANNED INTERVENTIONS: self care/ADL training, therapeutic exercise, therapeutic activity, patient/family education, cognitive remediation/compensation, coping strategies training, DME and/or AE instructions, and Re-evaluation  RECOMMENDED OTHER SERVICES: none at this time  CONSULTED AND AGREED WITH PLAN OF CARE: family member/caregiver  PLAN FOR NEXT SESSION: review use of communication board; puzzle on ipad with guided access and guided access timer.  Dennis Bast, OT 09/04/2022, 2:14 PM

## 2022-09-11 ENCOUNTER — Encounter: Payer: Medicaid Other | Admitting: Speech Pathology

## 2022-09-11 ENCOUNTER — Encounter: Payer: Self-pay | Admitting: Occupational Therapy

## 2022-09-11 ENCOUNTER — Ambulatory Visit: Payer: Medicaid Other | Attending: Family Medicine | Admitting: Occupational Therapy

## 2022-09-11 DIAGNOSIS — F801 Expressive language disorder: Secondary | ICD-10-CM | POA: Insufficient documentation

## 2022-09-11 DIAGNOSIS — Q909 Down syndrome, unspecified: Secondary | ICD-10-CM | POA: Insufficient documentation

## 2022-09-11 DIAGNOSIS — Z789 Other specified health status: Secondary | ICD-10-CM | POA: Diagnosis present

## 2022-09-11 DIAGNOSIS — F802 Mixed receptive-expressive language disorder: Secondary | ICD-10-CM | POA: Insufficient documentation

## 2022-09-11 NOTE — Therapy (Signed)
OUTPATIENT OCCUPATIONAL THERAPY TREATMENT  Patient Name: Brandon Bennett MRN: [id number] DOB:Nov 03, 2000, 22 y.o., male Today's Date: 09/11/2022  PCP: Brandon Button, MD  REFERRING PROVIDER: Zola Button, MD   END OF SESSION:  OT End of Session - 09/11/22 1430     Visit Number 7    Number of Visits 9    Date for OT Re-Evaluation 08/14/22    Authorization Type Medicaid Brandon Bennett    Authorization - Number of Visits 9    OT Start Time 1321    OT Stop Time 1402    OT Time Calculation (min) 41 min    Activity Tolerance Patient tolerated treatment well    Behavior During Therapy WFL for tasks assessed/performed             Past Medical History:  Diagnosis Date   Down syndrome    History reviewed. No pertinent surgical history. 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   Inappropriate behavior 06/05/2020   Insomnia 06/05/2020   Elevated liver enzymes 06/05/2020   Heart burn 02/12/2020   Hidradenitis suppurativa 06/16/2013   DOWN SYNDROME 09/08/2007    ONSET DATE: 05/15/2022 (date of referral)  REFERRING DIAG: Q90.9 (ICD-10-CM) - Down's syndrome   THERAPY DIAG:  Decreased activities of daily living (ADL)  DOWN SYNDROME  Expressive language disorder  Receptive expressive language disorder  Rationale for Evaluation and Treatment: Rehabilitation  SUBJECTIVE:   SUBJECTIVE STATEMENT: The pt's mother reports th pt has showered independently twice since his last visit.   Pt accompanied by:  mother  - Brandon Bennett    PERTINENT HISTORY: Pt dx with Down's Sydrome. Had ST, OT, and PT all through school. Graduated in 2021. Verbal expression has decr'd since COVID and pt rarely communicates in words at this time with ADL decline.   PRECAUTIONS: Other: pt is mostly non-verbal  WEIGHT BEARING RESTRICTIONS: No  PAIN:  Are you having pain? No  FALLS: Has  patient fallen in last 6 months? No  LIVING ENVIRONMENT: Lives with: lives with their family and with mother and father Lives in: House/apartment Stairs: Yes: Internal: 2 steps; none Has following equipment at home: None  PLOF: Needs assistance with ADLs  PATIENT GOALS: Mom would like for pt to be more engaging and complete ADLs at PLOF.  OBJECTIVE:   HAND DOMINANCE: Right  ADLs: Overall ADLs: mother has to initiate since COVID with no more than 25% assistance from mother; she had been able to set clothes out for patient but now has to physically assist pt IADLs:  Prior to [place], he put away clean laundry  FUNCTIONAL OUTCOME MEASURES:    BUE ROM and MMT : WLF  COORDINATION: Was able to write name when he was in school.  Has not recently attempted.  COGNITION: Overall cognitive status: History of cognitive impairments - at baseline and see ST eval for further details.   OBSERVATIONS: Pt demonstrating clicking tongue, grinding teeth, rubbing hands, rubbing his face and head, and banging palms together throughout session.   TODAY'S TREATMENT:                                                                                                                               -  Self-care/home management completed for duration as noted below including: OT reviewed use of communication board with mother and initiated new activities as noted in pt instructions. Mom reports they have been using the board more.   PATIENT EDUCATION: Education details: use of Data processing manager; iPad use. Person educated: Parents Education method: Customer service manager Education comprehension: verbalized understanding, returned demonstration, and needs further education  HOME EXERCISE PROGRAM: N/A  GOALS: Goals reviewed with parent? Yes  SHORT TERM GOALS: Target date: 07/17/2022   Caregiver will demonstrate appropriate use of communication board.   Baseline: does not utilize Goal status:  IN PROGRESS - Have attempted use but still require education for appropriate use  2.  Patient's mother will report at least 1-point increase in average PSFS score or at least 2-point increase in a single activity score.  Baseline: PSFS score =1.3; see above for full details Goal status: IN PROGRESS - mother reports little change but no change to PSFS score yet    LONG TERM GOALS: Target date: 08/14/2022    Patient's mother will report at least two-point increase in average PSFS score or at least three-point increase in a single activity score indicating functionally significant improvement given minimum detectable change.  Baseline: PSFS score =1.3; see above for full details Goal status: IN PROGRESS - mother reports little change but no change to PSFS score yet  2.  Pt will utilize communication board at home on a daily basis.  Baseline: does not utilize Goal status: IN PROGRESS   ASSESSMENT:  CLINICAL IMPRESSION: Pt to benefit from skilled OT services in the outpatient setting to encourage more active engagement in ADLs and IADLs at [facility] as able. Pt and caregivers remain motivated to continue efforts; however, given pt's diagnosis and lack of caregiver insight, further education will be required in order to improve pt's functional status and to promote desired change in daily routines.   PERFORMANCE DEFICITS: in functional skills including ADLs, IADLs, and UE functional use  IMPAIRMENTS: are limiting patient from ADLs and play.   COMORBIDITIES: has co-morbidities such as inappropriate behavior and history of limited engagement/ability to follow commands  that affects occupational performance. Patient will benefit from skilled OT to address above impairments and improve overall function.  REHAB POTENTIAL: Fair given behavioral and cognitive impairments   PLAN:  OT FREQUENCY: 1x/week  OT DURATION: 8 weeks  PLANNED INTERVENTIONS: self care/ADL training, therapeutic exercise,  therapeutic activity, patient/family education, cognitive remediation/compensation, coping strategies training, DME and/or AE instructions, and Re-evaluation  RECOMMENDED OTHER SERVICES: none at this time  CONSULTED AND AGREED WITH PLAN OF CARE: family member/caregiver  PLAN FOR NEXT SESSION: review use of communication board; puzzle on ipad with guided access and guided access timer.  Dennis Bast, OT 09/11/2022, 2:32 PM   Check all possible CPT codes: [phone] - OT Re-evaluation, 97110- Therapeutic Exercise, 97530 - Therapeutic Activities, and 97535 - Self Care    Check all conditions that are expected to impact treatment: Cognitive impairment, Neurological condition, Psychological disorders, and Social determinants of health   If treatment provided at initial evaluation, no treatment charged due to lack of authorization.

## 2022-11-11 NOTE — Therapy (Signed)
OCCUPATIONAL THERAPY DISCHARGE SUMMARY  Patient last seen 09/2022 for OT. Pt has not had any more visits since that time. OT to discharge pt from caseload and resolve episode.

## 2023-02-12 ENCOUNTER — Telehealth: Payer: Self-pay | Admitting: Family Medicine

## 2023-02-12 ENCOUNTER — Other Ambulatory Visit (HOSPITAL_COMMUNITY): Payer: Self-pay | Admitting: Family Medicine

## 2023-02-12 DIAGNOSIS — G4700 Insomnia, unspecified: Secondary | ICD-10-CM

## 2023-02-12 MED ORDER — TRAZODONE HCL 50 MG PO TABS
25.0000 mg | ORAL_TABLET | Freq: Every evening | ORAL | 5 refills | Status: DC | PRN
Start: 2023-02-12 — End: 2023-09-15

## 2023-02-12 NOTE — Telephone Encounter (Signed)
Patient needs refill on Trazodone. Please advise.

## 2023-02-12 NOTE — Telephone Encounter (Signed)
Informed guardian that medication was sent to pharmacy.

## 2023-02-16 NOTE — Progress Notes (Deleted)
    SUBJECTIVE:   CHIEF COMPLAINT / HPI:   Possible ear infection  Skin concern  HPV #2, Hep C testing  PERTINENT  PMH / PSH: Down syndrome (non-verbal), hydradenitis suppurativa  OBJECTIVE:   There were no vitals taken for this visit. ***  General: NAD, pleasant, able to participate in exam Cardiac: RRR, no murmurs. Respiratory: CTAB, normal effort, No wheezes, rales or rhonchi Abdomen: Bowel sounds present, nontender, nondistended Extremities: no edema or cyanosis. Skin: warm and dry, no rashes noted Neuro: alert, no obvious focal deficits Psych: Normal affect and mood  ASSESSMENT/PLAN:   No problem-specific Assessment & Plan notes found for this encounter.     Dr. Elberta Fortis, DO [facility]    {    This will disappear when note is signed, click to select method of visit    :1}

## 2023-02-17 ENCOUNTER — Ambulatory Visit: Payer: Medicaid Other

## 2023-02-18 ENCOUNTER — Ambulatory Visit (INDEPENDENT_AMBULATORY_CARE_PROVIDER_SITE_OTHER): Payer: MEDICAID | Admitting: Student

## 2023-02-18 VITALS — BP 146/55 | Ht 60.0 in | Wt 97.6 lb

## 2023-02-18 DIAGNOSIS — H6502 Acute serous otitis media, left ear: Secondary | ICD-10-CM

## 2023-02-18 DIAGNOSIS — L732 Hidradenitis suppurativa: Secondary | ICD-10-CM | POA: Diagnosis not present

## 2023-02-18 DIAGNOSIS — H6590 Unspecified nonsuppurative otitis media, unspecified ear: Secondary | ICD-10-CM | POA: Insufficient documentation

## 2023-02-18 DIAGNOSIS — H6090 Unspecified otitis externa, unspecified ear: Secondary | ICD-10-CM | POA: Insufficient documentation

## 2023-02-18 MED ORDER — DEBROX 6.5 % OT SOLN: 5.0000 [drp] | Freq: Two times a day (BID) | OTIC | 0 refills | Status: DC

## 2023-02-18 MED ORDER — CETIRIZINE HCL 10 MG PO TABS: 10.0000 mg | ORAL_TABLET | Freq: Every day | ORAL | 11 refills | Status: AC

## 2023-02-18 MED ORDER — CLINDAMYCIN PHOSPHATE 1 % EX GEL
Freq: Two times a day (BID) | CUTANEOUS | 0 refills | Status: DC
Start: 2023-02-18 — End: 2023-02-27

## 2023-02-18 MED ORDER — DIPHENHYDRAMINE HCL 50 MG PO TABS: ORAL_TABLET | ORAL | 0 refills | Status: AC

## 2023-02-18 NOTE — Assessment & Plan Note (Addendum)
Left-sided.  No concern for bacterial AOM. Treat with antihistamines-Benadryl 50 mg nightly, Zyrtec 10 mg daily Return precautions discussed should he develop fever, or symptoms worsen or do not improve.  Attempted irrigation of right sided cerumen impaction, patient unable to tolerate. Prescribed Debrox drops instead

## 2023-02-18 NOTE — Progress Notes (Signed)
    SUBJECTIVE:   CHIEF COMPLAINT / HPI:   Brandon Bennett is a 22 year-old here with his mom and sister to discuss ear pain for 2 weeks. They say he has had leaking of fluid from the left ear.  He has been rubbing at his ear. Temperature at home was 101.0 F axillary, per mom. No cough, runny nose,vomiting, diarrhea.  He has been taking Augmentin at home (that they had leftover from a prior infection), took for 5 days twice daily. She says it did not help.   PERTINENT  PMH / PSH: Down syndrome (non-verbal), hydradenitis suppurativa  OBJECTIVE:   BP (!) 146/55   Ht 5' (1.524 m)   Wt 97 lb 9.6 oz (44.3 kg)   BMI 19.06 kg/m   General: Nonverbal, fidgety, pleasant HEENT: Serous fluid noted in left ear and behind left TM, no effusion or bulging or erythema.  Right ear impacted with cerumen..  Mucous membranes moist.  No oropharyngeal erythema Skin: 1 x 2 cm nodule groin with surrounding erythema consistent with HS with scarring Extremities: Warm and well-perfused   ASSESSMENT/PLAN:   Serous otitis media Left-sided.  No concern for bacterial AOM. Treat with antihistamines-Benadryl 50 mg nightly, Zyrtec 10 mg daily Return precautions discussed should he develop fever, or symptoms worsen or do not improve.  Attempted irrigation of right sided cerumen impaction, patient unable to tolerate. Prescribed Debrox drops instead  Hidradenitis suppurativa Prescribed topical Clindagel to be used twice daily Dermatology referral placed today as they have not had follow-up in quite some time     Darral Dash, DO [facility]

## 2023-02-18 NOTE — Patient Instructions (Addendum)
It was great seeing you today.  As we discussed, -The fluid coming from his left ear is likely due to a viral illness or allergies.  He does not look like he has a bacterial ear infection today.  We will treat this with Benadryl every night for the next 5 to 7 days, and cetirizine daily. -If he is not getting better by Monday, please let us know  Please apply clindamycin gel twice daily to his groin area until you notice improvement  Please schedule follow-up appoint with your primary care doctor, Dr. Barb Merino in the next 4 weeks.   If you have any questions or concerns, please feel free to call the clinic.   Have a wonderful day,  Dr. Darral Dash [facility] [phone]

## 2023-02-18 NOTE — Assessment & Plan Note (Signed)
Prescribed topical Clindagel to be used twice daily Dermatology referral placed today as they have not had follow-up in quite some time

## 2023-02-25 ENCOUNTER — Encounter: Payer: Self-pay | Admitting: Student

## 2023-02-26 ENCOUNTER — Other Ambulatory Visit: Payer: Self-pay

## 2023-02-26 ENCOUNTER — Telehealth: Payer: Self-pay

## 2023-02-26 ENCOUNTER — Other Ambulatory Visit (HOSPITAL_COMMUNITY): Payer: Self-pay

## 2023-02-26 DIAGNOSIS — L732 Hidradenitis suppurativa: Secondary | ICD-10-CM

## 2023-02-26 NOTE — Telephone Encounter (Signed)
Pharmacy Patient Advocate Encounter   Received notification from CoverMyMeds that prior authorization for CLINDAMYCIN 1% GEL is required/requested.   Insurance verification completed.   The patient is insured through [place] .   Per test claim:  THE FOLLOWING is preferred by the ins.  If suggested medication is appropriate, Please send in a new RX and discontinue this one. If not, please advise as to why it's not appropriate so that we may request a Prior Authorization.

## 2023-02-27 MED ORDER — CLINDAMYCIN PHOSPHATE 1 % EX LOTN
TOPICAL_LOTION | Freq: Two times a day (BID) | CUTANEOUS | 0 refills | Status: AC
Start: 2023-02-27 — End: ?

## 2023-02-27 NOTE — Addendum Note (Signed)
Addended by: Darnelle Spangle B on: 02/27/2023 02:56 AM   Modules accepted: Orders

## 2023-03-02 MED ORDER — CLINDAMYCIN PHOSPHATE 1 % EX SOLN
Freq: Two times a day (BID) | CUTANEOUS | 0 refills | Status: DC
Start: 2023-03-02 — End: 2024-01-24

## 2023-03-02 NOTE — Addendum Note (Signed)
Addended by: Darnelle Spangle B on: 03/02/2023 08:01 AM   Modules accepted: Orders

## 2023-03-04 ENCOUNTER — Telehealth: Payer: Self-pay | Admitting: Student

## 2023-03-04 ENCOUNTER — Telehealth (INDEPENDENT_AMBULATORY_CARE_PROVIDER_SITE_OTHER): Payer: MEDICAID | Admitting: Student

## 2023-03-04 DIAGNOSIS — H9210 Otorrhea, unspecified ear: Secondary | ICD-10-CM

## 2023-03-04 NOTE — Progress Notes (Signed)
Patient cancelled appointment and rescheduled for an in person appointment

## 2023-03-04 NOTE — Telephone Encounter (Signed)
Called to inquire about infectious symptoms. Father notes that patient is not acting like himself, but has not had any fever's and appears okay, they do not know/think he's having headaches. Father felt that patient was safe to wait until appt on Friday.

## 2023-03-04 NOTE — Telephone Encounter (Signed)
Called and spoke with patient father about appointment. The plan was to have the visit via telemedicine today, however, father is noting drainage of fluid from ear. I told father I would like to see ear in person, to evaluate. Father opted to cancel tele appointment and was scheduled for a clinic appt on Friday 03/05/23 @ 2:30.

## 2023-03-05 ENCOUNTER — Ambulatory Visit (INDEPENDENT_AMBULATORY_CARE_PROVIDER_SITE_OTHER): Payer: MEDICAID | Admitting: Student

## 2023-03-05 ENCOUNTER — Other Ambulatory Visit: Payer: Self-pay

## 2023-03-05 VITALS — BP 122/92 | Ht 60.0 in | Wt 97.0 lb

## 2023-03-05 DIAGNOSIS — L732 Hidradenitis suppurativa: Secondary | ICD-10-CM

## 2023-03-05 DIAGNOSIS — R12 Heartburn: Secondary | ICD-10-CM

## 2023-03-05 DIAGNOSIS — H6092 Unspecified otitis externa, left ear: Secondary | ICD-10-CM

## 2023-03-05 MED ORDER — DOXYCYCLINE HYCLATE 100 MG PO CAPS
100.0000 mg | ORAL_CAPSULE | Freq: Two times a day (BID) | ORAL | 0 refills | Status: DC
Start: 2023-03-05 — End: 2024-01-24

## 2023-03-05 MED ORDER — PANTOPRAZOLE SODIUM 20 MG PO TBEC: 20.0000 mg | DELAYED_RELEASE_TABLET | Freq: Every day | ORAL | 0 refills | Status: DC

## 2023-03-05 MED ORDER — CIPROFLOXACIN HCL 0.2 % OT SOLN
0.2000 mL | Freq: Two times a day (BID) | OTIC | 0 refills | Status: DC
Start: 2023-03-05 — End: 2023-03-12

## 2023-03-05 NOTE — Assessment & Plan Note (Signed)
No current flareup but extensive scarring. -Continue Clindagel -Rx for Doxy refilled to have on hand -Given information to contact dermatology office which she was previously referred to

## 2023-03-05 NOTE — Progress Notes (Signed)
    SUBJECTIVE:   CHIEF COMPLAINT / HPI:   Not feeling well and ear drainage   Pt laughing a lot which father states means something is wrong.  Was seen on 02/18/23 for fluid leaking from ear and fever. Was dx w/ serous OM and prescribed benadryl and zyrtec.   HS  Currently applying Clindagel to groin.  No current painful lesions.  Previously had prescription for doxycycline to have on hand for flareups.  Would like this refilled.  Heartburn Previously prescribed Pepcid for what was thought to be heartburn as patient will bang on his chest after eating on occasion.  Father states Pepcid does not seem to be working anymore.  Does not seem to have difficulty swallowing foods and does not regurgitate or cough foods up.  PERTINENT  PMH / PSH: Down syndrome, at bedtime  OBJECTIVE:   BP (!) 122/92   Ht 5' (1.524 m)   Wt 97 lb (44 kg)   SpO2 98%   BMI 18.94 kg/m    General: NAD, pleasant HEENT: White sclera, clear conjunctiva, left external ear canal coated in purulent fluid, no erythema or bulging of TM, cone light present.  Right TM with blue ear tube in place and no leaking fluid Cardiac: RRR, no murmurs. Respiratory: CTAB, normal effort, No wheezes, rales or rhonchi skin: Chaperoned by CMA.  Diffuse significant scarring in groin area, no current inflamed, nodular, tender or leaking lesions Neuro: alert, laughing repetitively   ASSESSMENT/PLAN:   Hidradenitis suppurativa No current flareup but extensive scarring. -Continue Clindagel -Rx for Doxy refilled to have on hand -Given information to contact dermatology office which she was previously referred to  Otitis externa Exam consistent with otitis externa -Ciprofloxacin twice daily for 7 days in L ear -Return if no improvement  Heart burn Shared decision making used, will trial PPI in place of Pepcid. -Rx for Protonix -Return if patient continues being home his chest after eating, may need further evaluation and consider  difficulty swallowing     Dr. Erick Alley, DO [facility]

## 2023-03-05 NOTE — Assessment & Plan Note (Signed)
Exam consistent with otitis externa -Ciprofloxacin twice daily for 7 days in L ear -Return if no improvement

## 2023-03-05 NOTE — Patient Instructions (Addendum)
It was great to see you! Thank you for allowing me to participate in your care!  I recommend that you always bring your medications to each appointment as this makes it easy to ensure you are on the correct medications and helps [place] not miss when refills are needed.  Our plans for today:  - prescription for antibiotic ear drops  sent to pharmacy  - To schedule dermatology apt, call: [facility] [address] [place],  [place]  [address] Main: [phone] -Prescription of doxycycline sent to pharmacy  -Return in near future to discuss other concerns if symptoms are not resolved with Protonix which has been sent to pharmacy.   Take care and seek immediate care sooner if you develop any concerns.   Dr. Erick Alley, DO [facility]

## 2023-03-05 NOTE — Assessment & Plan Note (Signed)
Shared decision making used, will trial PPI in place of Pepcid. -Rx for Protonix -Return if patient continues being home his chest after eating, may need further evaluation and consider difficulty swallowing

## 2023-03-11 ENCOUNTER — Other Ambulatory Visit (HOSPITAL_COMMUNITY): Payer: Self-pay

## 2023-03-11 ENCOUNTER — Telehealth: Payer: Self-pay

## 2023-03-11 NOTE — Telephone Encounter (Signed)
Pharmacy Patient Advocate Encounter   Received notification from CoverMyMeds that prior authorization for CIPROFLOXACIN 0.2% SOLUTION is required/requested.   Insurance verification completed.   The patient is insured through [facility] .   Per test claim: PA required; PA submitted to [facility] Medicaid via CoverMyMeds Key/confirmation #/EOC [id number]. Status is pending

## 2023-03-12 ENCOUNTER — Other Ambulatory Visit: Payer: Self-pay | Admitting: Student

## 2023-03-12 DIAGNOSIS — H6092 Unspecified otitis externa, left ear: Secondary | ICD-10-CM

## 2023-03-12 MED ORDER — CIPROFLOXACIN-DEXAMETHASONE 0.3-0.1 % OT SUSP
4.0000 [drp] | Freq: Two times a day (BID) | OTIC | 0 refills | Status: DC
Start: 2023-03-12 — End: 2024-01-24

## 2023-03-12 NOTE — Progress Notes (Signed)
Prior ciprofloxacin otic solution was denied by insurance.  New prescription sent in for Ciprodex to treat otitis externa. Called patient's father and left voicemail that new prescription had been sent

## 2023-03-12 NOTE — Telephone Encounter (Signed)
Pharmacy Patient Advocate Encounter  Pharmacy Patient Advocate Encounter  Received notification from [facility] that Prior Authorization for CIPROFLOXACIN 0.2% SOLUTION  has been DENIED.  Full denial letter will be uploaded to the media tab. See denial reason below.    PA #/Case ID/Reference #: [id number]

## 2023-05-11 ENCOUNTER — Other Ambulatory Visit: Payer: Self-pay | Admitting: Student

## 2023-05-11 DIAGNOSIS — R12 Heartburn: Secondary | ICD-10-CM

## 2023-06-17 ENCOUNTER — Other Ambulatory Visit: Payer: Self-pay | Admitting: Family Medicine

## 2023-06-17 DIAGNOSIS — R12 Heartburn: Secondary | ICD-10-CM

## 2023-07-29 ENCOUNTER — Other Ambulatory Visit: Payer: Self-pay | Admitting: Family Medicine

## 2023-07-29 DIAGNOSIS — R12 Heartburn: Secondary | ICD-10-CM

## 2023-08-02 ENCOUNTER — Ambulatory Visit (INDEPENDENT_AMBULATORY_CARE_PROVIDER_SITE_OTHER): Payer: MEDICAID | Admitting: Family Medicine

## 2023-08-02 ENCOUNTER — Encounter: Payer: Self-pay | Admitting: Family Medicine

## 2023-08-02 VITALS — BP 106/60 | HR 90 | Ht 60.0 in | Wt 96.8 lb

## 2023-08-02 DIAGNOSIS — R1030 Lower abdominal pain, unspecified: Secondary | ICD-10-CM

## 2023-08-02 DIAGNOSIS — R21 Rash and other nonspecific skin eruption: Secondary | ICD-10-CM | POA: Diagnosis not present

## 2023-08-02 DIAGNOSIS — J309 Allergic rhinitis, unspecified: Secondary | ICD-10-CM | POA: Diagnosis not present

## 2023-08-02 DIAGNOSIS — H259 Unspecified age-related cataract: Secondary | ICD-10-CM | POA: Diagnosis not present

## 2023-08-02 NOTE — Progress Notes (Unsigned)
    SUBJECTIVE:   CHIEF COMPLAINT / HPI:    OH  Hx of intellectual delay *** - For past few months, has been doing a "sniffing" action. Family thinks he may be more congested. This is a new behavior for him - Family also worried his acid reflux might be contributing - He takes zyrtec, benadryl - No one smo0kes - Sister and dad have eczema - Family has seasonal allergies - Has a lot of runny nose.      Rash - He gets "petichiae" on his stomach and back - He gets random red bumps on his arms and legs. Does not seem itchy or bothering him. The spots are flat. - They are still present, they popped up about 2-3 yrs ago.  - No hx of nose or gum bleeding   Groin Pain - Reports sometimes having discomfort in his groin. Sometimes he will bend forward and wince and grab his crotch. - These episodes last a few seconds. - Does not seem to have pain with peeing        - Has hx of cryporhidism, worried his groin might be bothering him  - Reports he has pain.   Cough?  Sedated dentist?  PERTINENT  PMH / PSH: ***  OBJECTIVE:   BP 106/60   Pulse 90   Ht 5' (1.524 m)   Wt 96 lb 12.8 oz (43.9 kg)   SpO2 97%   BMI 18.90 kg/m   ***   Inflamed nasal turbinates No LAD   Diffuse scattered Back acne with hypopigmented scars  ASSESSMENT/PLAN:   No problem-specific Assessment & Plan notes found for this encounter.     Lincoln Brigham, MD [facility]

## 2023-08-02 NOTE — Patient Instructions (Addendum)
Good to see you today - Thank you for coming in  Things we discussed today:  1) The bumps on his back look most like acne and acne scars.  2) His sniffing is most likely due to allergic rhinitis. His sinuses look inflamed. - Continue to take Zyrtec daily - Try to give flonase 1 spray in each nostril per day. This can help decrease the inflammation in his nose, but can also be uncomfortable to apply.  He does not need to use Flonase if it is too uncomfortable for him,  3) For his groin, - seek emergency medical attention if you notice his testicles/scrotum appear swollen, extremely tender, or one/both testicles are missing from the scrotum. These could be a sign of testicular torsion, which can be a medical emergency. - Otherwise, he may also be having abdominal cramping. See if he is contipated.   Here are some offices that may be able to provide sedated dentistry:  Oral Surgeon- Georgia Lopes, DMD, PA Address: [address], [place], [place] [address] Phone: [phone]  [facility] Address: [address] [place], [place], [place] [address] Phone: [phone]

## 2023-09-14 ENCOUNTER — Other Ambulatory Visit: Payer: Self-pay | Admitting: Family Medicine

## 2023-09-14 ENCOUNTER — Ambulatory Visit (INDEPENDENT_AMBULATORY_CARE_PROVIDER_SITE_OTHER): Payer: MEDICAID | Admitting: Student

## 2023-09-14 VITALS — BP 124/68 | HR 100 | Temp 97.7°F | Ht 60.0 in | Wt 92.4 lb

## 2023-09-14 DIAGNOSIS — G4700 Insomnia, unspecified: Secondary | ICD-10-CM

## 2023-09-14 DIAGNOSIS — R058 Other specified cough: Secondary | ICD-10-CM

## 2023-09-14 NOTE — Progress Notes (Signed)
    SUBJECTIVE:   CHIEF COMPLAINT / HPI:   Sister acts as interpreter   Sick Symptoms Started ~3 weeks ago while in [place] ( on day 5 of trip) with subjective fever, diarrhea, congestion, and cough.  Mother, sister, and brother-in-law all had similar symptoms.  Diarrhea has resolved and he seems to be feeling better. Congestion and mild dry cough have persisted, sister states he felt warm 2 days ago but did not check temperature. Difficultly sleeping d/t congestion and has sniffing fits.  Eating a little less than normal but is still eating and is drinking well.  Tires more easily than normal but overall seems to feel okay Cannot communicate d/t down syndrome so difficult for family to tell how he feels Was seen on 1/20 with complaints of sniffing/snorting but per sister this is different.   PERTINENT  PMH / PSH: Down syndrome  OBJECTIVE:   BP 124/68   Pulse 100   Temp 97.7 F (36.5 C) (Axillary)   Ht 5' (1.524 m)   Wt 92 lb 6.4 oz (41.9 kg)   SpO2 100%   BMI 18.05 kg/m    General: NAD, pleasant, able to participate in exam HEENT: White sclera, clear conjunctiva, MMM Cardiac: RRR, no murmurs, Cap refill <2 sec Respiratory: CTAB, normal effort, No wheezes, rales or rhonchi Skin: warm and dry Neuro: alert, non verbal   ASSESSMENT/PLAN:   Post-viral cough syndrome Nasal congestion and cough likely post viral symptoms.  Other symptoms have since resolved and he is improving.  Vitals are stable and exam is nonconcerning, lungs are clear and he is breathing comfortably. Low concern for PNA.  -Flonase as tolerated -Supportive care and return precautions discussed     Dr. Erick Alley, DO [facility]

## 2023-09-14 NOTE — Assessment & Plan Note (Signed)
 Nasal congestion and cough likely post viral symptoms.  Other symptoms have since resolved and he is improving.  Vitals are stable and exam is nonconcerning, lungs are clear and he is breathing comfortably. Low concern for PNA.  -Flonase as tolerated -Supportive care and return precautions discussed

## 2023-09-14 NOTE — Patient Instructions (Signed)
 It was great to see you! Thank you for allowing me to participate in your care!   Our plans for today:  -I think you have a postviral cough which will likely improve on its own over the next few weeks.  I recommend supportive care including Flonase use as able, hot tea with honey or honey on a spoon. -If symptoms worsen (especially shortness of breath) or do not improve, within the next week or 2, please return  Take care and seek immediate care sooner if you develop any concerns.   Dr. Erick Alley, DO [facility]

## 2023-10-06 ENCOUNTER — Other Ambulatory Visit: Payer: Self-pay | Admitting: Family Medicine

## 2023-10-06 DIAGNOSIS — R12 Heartburn: Secondary | ICD-10-CM

## 2023-10-06 DIAGNOSIS — G4700 Insomnia, unspecified: Secondary | ICD-10-CM

## 2023-11-01 ENCOUNTER — Other Ambulatory Visit: Payer: Self-pay | Admitting: Family Medicine

## 2023-11-01 DIAGNOSIS — R12 Heartburn: Secondary | ICD-10-CM

## 2023-11-09 ENCOUNTER — Other Ambulatory Visit: Payer: Self-pay | Admitting: Family Medicine

## 2023-11-09 DIAGNOSIS — G4700 Insomnia, unspecified: Secondary | ICD-10-CM

## 2023-11-28 ENCOUNTER — Other Ambulatory Visit: Payer: Self-pay

## 2023-11-28 ENCOUNTER — Ambulatory Visit
Admission: EM | Admit: 2023-11-28 | Discharge: 2023-11-28 | Disposition: A | Discharge status: Home / Self Care | Payer: MEDICAID | Attending: Family Medicine | Admitting: Family Medicine

## 2023-11-28 DIAGNOSIS — R350 Frequency of micturition: Secondary | ICD-10-CM

## 2023-11-28 LAB — POCT URINALYSIS DIP (MANUAL ENTRY)
Bilirubin, UA: NEGATIVE
Blood, UA: NEGATIVE
Glucose, UA: NEGATIVE mg/dL
Ketones, POC UA: NEGATIVE mg/dL
Leukocytes, UA: NEGATIVE
Nitrite, UA: NEGATIVE
Protein Ur, POC: NEGATIVE mg/dL
Spec Grav, UA: 1.015 (ref 1.010–1.025)
Urobilinogen, UA: 0.2 U/dL
pH, UA: 6.5 (ref 5.0–8.0)

## 2023-11-28 NOTE — Discharge Instructions (Signed)
 His urine is negative for infection.    Please follow-up with your PCP for further evaluation of his symptoms.  Please go to emergency room if he develops any worsening symptoms.  Hope he feels better soon!

## 2023-11-28 NOTE — ED Provider Notes (Signed)
 [facility]    CSN: [id number] Arrival date & time: 11/28/23  1222      History   Chief Complaint Chief Complaint  Patient presents with   Urinary Frequency    HPI Brandon Bennett is a 23 y.o. male is brought in by family.   patient is nonverbal with history of Down syndrome.  Sisters report over the past few days he seems to be grabbing his penis and flinching as though he is in pain.  They have not noticed him expressing any pain when he urinates but does state he has been urinating more frequently.  They deny any malodorous urine, fevers, vomiting.  No history of UTIs.  Denies any rashes or bumps/lumps on penis no testicular swelling.  No other concerns at this time.   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 No pertinent surgical history.     Home Medications    Prior to Admission medications   Medication Sig Start Date End Date Taking? Authorizing Provider  carbamide peroxide (DEBROX) 6.5 % OTIC solution Place 5 drops into both ears 2 (two) times daily. 11/19/20   Autry-Lott, Jeneane Miracle, DO  carbamide peroxide (DEBROX) 6.5 % OTIC solution Place 5 drops into the right ear 2 (two) times daily. 02/18/23   Dameron, Marisa, DO  cetirizine  (ZYRTEC ) 10 MG tablet Take 1 tablet (10 mg total) by mouth daily. 02/18/23   Dameron, Marisa, DO  ciprofloxacin -dexamethasone  (CIPRODEX ) OTIC suspension Place 4 drops into the left ear 2 (two) times daily. For 7 days 03/12/23   Glenn Lange, DO  clindamycin   (CLEOCIN  T) 1 % external solution Apply topically 2 (two) times daily. 03/02/23   Limmie Ren, MD  diphenhydrAMINE  (BENADRYL ) 50 MG tablet Take 1 tablet at nighttime for the next 7 days. Use as needed for allergies therafter 02/18/23   Dameron, Marisa, DO  doxycycline  (VIBRAMYCIN ) 100 MG capsule Take 1 capsule (100 mg total) by mouth 2 (two) times daily. Open capsule and sprinkle into room temperature apple sauce. Use for painful, nodular or draining out breaks 03/05/23   Glenn Lange, DO  famotidine  (PEPCID ) 20 MG tablet Take 1 tablet (20 mg total) by mouth 2 (two) times daily. 02/09/20   Cresenzo, John V, MD  fluticasone  (FLONASE ) 50 MCG/ACT nasal spray Place 1 spray into both nostrils daily. 05/07/17   Gambino, Christina M, MD  pantoprazole  (PROTONIX ) 20 MG tablet Take 1 tablet by mouth once daily 11/01/23   Edison Gore, MD  traZODone  (DESYREL ) 50 MG tablet TAKE 1/2 TO 1 (ONE-HALF TO ONE) TABLET BY MOUTH AT BEDTIME FOR SLEEP 11/10/23   Edison Gore, MD    Family History Family History  Problem Relation Age of Onset   Healthy Mother     Social History     Allergies   Patient has no known allergies.   Review of Systems Review of Systems  Genitourinary:  Positive for frequency.     Physical Exam Triage Vital Signs ED Triage Vitals [11/28/23 1304]  Encounter Vitals  Group     BP (!) 93/57     Systolic BP Percentile      Diastolic BP Percentile      Pulse Rate 75     Resp 16     Temp 98.2 F (36.8 C)     Temp Source Oral     SpO2 95 %     Weight      Height      Head Circumference      Peak Flow      Pain Score      Pain Loc      Pain Education      Exclude from Growth Chart    No data found.  Updated Vital Signs BP (!) 93/57   Pulse 75   Temp 98.2 F (36.8 C) (Oral)   Resp 16   SpO2 95%   Visual Acuity Right Eye Distance:   Left Eye Distance:   Bilateral Distance:    Right Eye Near:   Left Eye Near:    Bilateral Near:     Physical Exam Vitals and  nursing note reviewed. Exam conducted with a chaperone present (both patients sisters present during exam).  Constitutional:      Appearance: Normal appearance.  HENT:     Head: Normocephalic and atraumatic.  Eyes:     Pupils: Pupils are equal, round, and reactive to light.  Cardiovascular:     Rate and Rhythm: Normal rate.  Pulmonary:     Effort: Pulmonary effort is normal.  Abdominal:     Tenderness: There is no right CVA tenderness or left CVA tenderness.  Genitourinary:    Penis: Normal.      Testes: Normal.  Skin:    General: Skin is warm and dry.  Neurological:     General: No focal deficit present.     Mental Status: He is alert and oriented to person, place, and time.  Psychiatric:        Mood and Affect: Mood normal.        Behavior: Behavior normal.      UC Treatments / Results  Labs (all labs ordered are listed, but only abnormal results are displayed) Labs Reviewed  POCT URINALYSIS DIP (MANUAL ENTRY)    EKG   Radiology No results found.  Procedures Procedures (including critical care time)  Medications Ordered in UC Medications - No data to display  Initial Impression / Assessment and Plan / UC Course  I have reviewed the triage vital signs and the nursing notes.  Pertinent labs & imaging results that were available during my care of the patient were reviewed by me and considered in my medical decision making (see chart for details).     Reviewed exam and symptoms with sisters.  He is hemodynamically stable and in no acute distress.  Urine is negative for UTI.  Patient was unable to provide enough urine for culture and as it took several attempts to obtain this the family did not want to provide any more.  Advised to follow-up with their PCP for further evaluation of his symptoms in 2 to 3 days.  ER precautions reviewed and sisters verbalized understanding. Final Clinical Impressions(s) / UC Diagnoses   Final diagnoses:  Urinary frequency      Discharge Instructions      His urine is negative for infection.    Please follow-up with your PCP for further evaluation of his symptoms.  Please go to emergency room if he develops any worsening  symptoms.  Hope he feels better soon!   ED Prescriptions   None    PDMP not reviewed this encounter.   Alleen Arbour, NP 11/28/23 1414

## 2023-11-28 NOTE — ED Triage Notes (Signed)
 Pt's sister states pt has been holding his penis and flinchingx3d. Pt's sister states pt has been having frequent urination

## 2023-12-17 ENCOUNTER — Other Ambulatory Visit: Payer: Self-pay | Admitting: Family Medicine

## 2023-12-17 DIAGNOSIS — G4700 Insomnia, unspecified: Secondary | ICD-10-CM

## 2024-01-12 ENCOUNTER — Other Ambulatory Visit: Payer: Self-pay | Admitting: Family Medicine

## 2024-01-12 DIAGNOSIS — G4700 Insomnia, unspecified: Secondary | ICD-10-CM

## 2024-01-17 ENCOUNTER — Other Ambulatory Visit: Payer: Self-pay | Admitting: Family Medicine

## 2024-01-17 DIAGNOSIS — R12 Heartburn: Secondary | ICD-10-CM

## 2024-01-19 ENCOUNTER — Ambulatory Visit: Payer: MEDICAID | Admitting: Physician Assistant

## 2024-01-19 ENCOUNTER — Encounter: Payer: Self-pay | Admitting: Physician Assistant

## 2024-01-19 DIAGNOSIS — L0292 Furuncle, unspecified: Secondary | ICD-10-CM

## 2024-01-19 DIAGNOSIS — L700 Acne vulgaris: Secondary | ICD-10-CM

## 2024-01-19 MED ORDER — CLINDAMYCIN PHOSPHATE 1 % EX LOTN: TOPICAL_LOTION | Freq: Every day | CUTANEOUS | 10 refills | Status: AC

## 2024-01-19 MED ORDER — TRETINOIN 0.025 % EX CREA: TOPICAL_CREAM | Freq: Every day | CUTANEOUS | 4 refills | Status: AC

## 2024-01-19 NOTE — Progress Notes (Signed)
   New Patient Visit   Subjective  Brandon Bennett is a 23 y.o. male who presents for the following: acne and possible hidradenitis suppurativa.   Has seen dermatology in [facility] many years ago and was prescribed doxycycline  and clindamycin  in the past which has helped--he flared when he stopped them.   Accompanied today by his mother and sister.    The following portions of the chart were reviewed this encounter and updated as appropriate: medications, allergies, medical history  Review of Systems:  No other skin or systemic complaints except as noted in HPI or Assessment and Plan.  Objective  Well appearing patient in no apparent distress; mood and affect are within normal limits.   A focused examination was performed of the following areas: Face and back and groin  Relevant exam findings are noted in the Assessment and Plan.    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 Condition is bothersome/symptomatic for patient. Currently flared.   Treatment Plan: Tretinoin  0.025% to face and back every other night Clindamycin  lotion to areas below the waist every day Wash with Panoxyl to affected areas every other day  FURUNCLES  Exam: resolving papules.   Treatment Plan:  As above     NODULOCYSTIC ACNE   FURUNCLES    Return if symptoms worsen or fail to improve.  I, Darice Smock, CMA, am acting as scribe for [employer], PA-C.   Documentation: I have reviewed the above documentation for accuracy and completeness, and I agree with the above.  Stewart Pimenta K, PA-C

## 2024-01-19 NOTE — Patient Instructions (Addendum)
 Tretinoin  0.025% to face and back every other night. Apply pea size amount to face and 4 peas to back Clindamycin  lotion to areas below the waist every day Panoxyl wash to affected areas every other day--can bleach clothes.   Important Information   Due to recent changes in healthcare laws, you may see results of your pathology and/or laboratory studies on MyChart before the doctors have had a chance to review them. We understand that in some cases there may be results that are confusing or concerning to you. Please understand that not all results are received at the same time and often the doctors may need to interpret multiple results in order to provide you with the best plan of care or course of treatment. Therefore, we ask that you please give us  2 business days to thoroughly review all your results before contacting the office for clarification. Should we see a critical lab result, you will be contacted sooner.     If You Need Anything After Your Visit   If you have any questions or concerns for your doctor, please call our main line at [phone]. If no one answers, please leave a voicemail as directed and we will return your call as soon as possible. Messages left after 4 pm will be answered the following business day.    You may also send us  a message via MyChart. We typically respond to MyChart messages within 1-2 business days.  For prescription refills, please ask your pharmacy to contact our office. Our fax number is [phone].  If you have an urgent issue when the clinic is closed that cannot wait until the next business day, you can page your doctor at the number below.     Please note that while we do our best to be available for urgent issues outside of office hours, we are not available 24/7.    If you have an urgent issue and are unable to reach us , you may choose to seek medical care at your doctor's office, retail clinic, urgent care center, or emergency room.   If  you have a medical emergency, please immediately call 911 or go to the emergency department. In the event of inclement weather, please call our main line at [phone] for an update on the status of any delays or closures.  Dermatology Medication Tips: Please keep the boxes that topical medications come in in order to help keep track of the instructions about where and how to use these. Pharmacies typically print the medication instructions only on the boxes and not directly on the medication tubes.   If your medication is too expensive, please contact our office at [phone] or send us  a message through MyChart.    We are unable to tell what your co-pay for medications will be in advance as this is different depending on your insurance coverage. However, we may be able to find a substitute medication at lower cost or fill out paperwork to get insurance to cover a needed medication.    If a prior authorization is required to get your medication covered by your insurance company, please allow us  1-2 business days to complete this process.   Drug prices often vary depending on where the prescription is filled and some pharmacies may offer cheaper prices.   The website www.goodrx.com contains coupons for medications through different pharmacies. The prices here do not account for what the cost may be with help from insurance (it may be cheaper with your insurance), but the  website can give you the price if you did not use any insurance.  - You can print the associated coupon and take it with your prescription to the pharmacy.  - You may also stop by our office during regular business hours and pick up a GoodRx coupon card.  - If you need your prescription sent electronically to a different pharmacy, notify our office through [facility] or by phone at [phone]

## 2024-01-24 ENCOUNTER — Ambulatory Visit (INDEPENDENT_AMBULATORY_CARE_PROVIDER_SITE_OTHER): Payer: MEDICAID

## 2024-01-24 VITALS — BP 87/67 | HR 70 | Ht 60.0 in | Wt 93.6 lb

## 2024-01-24 DIAGNOSIS — Z0000 Encounter for general adult medical examination without abnormal findings: Secondary | ICD-10-CM

## 2024-01-24 DIAGNOSIS — J3489 Other specified disorders of nose and nasal sinuses: Secondary | ICD-10-CM | POA: Diagnosis not present

## 2024-01-24 NOTE — Progress Notes (Signed)
    SUBJECTIVE:   CHIEF COMPLAINT / HPI:   Brandon Bennett is a 23 year old non-verbal male who presents with his mother and sister for annual physical.  Mother at bedside is primary historian and has concern of persistent nasal itching.  He has been using Flonase  nasal spray since the spring almost daily.  She denies any epistaxis however notes lots of dryness and the patient rubbing his nose frequently.  She does note recent grunting vocalizations for the last month. They seem to occur when the patient is annoyed or displeased but she would like to make sure they do not correlate with the patient being in pain.  They have not noticed any recent trauma, falls, or other obvious indicators of pain.  PERTINENT  PMH / PSH: Down syndrome, nonverbal   OBJECTIVE:   BP (!) 87/67   Pulse 70   Wt 93 lb 9.6 oz (42.5 kg)   SpO2 98%   BMI 18.28 kg/m    General: alert, NAD HEENT: No sign of trauma, EOM grossly intact, clear nares, mild erythema at tip of nose where patient is constantly rubbing Cardiac: RRR, no m/r/g Respiratory: No wheezes, rales, or rhonchi appreciated however limited exam due to patient unable to follow commands GI: Soft, NTTP, non-distended  Extremities: NTTP, no peripheral edema. Neuro: Normal gait, moves all four extremities appropriately, nonverbal   ASSESSMENT/PLAN:   Assessment & Plan Encounter for annual physical exam Patient presents with mother and sister at bedside.  Patient has been doing well other than nasal itching and some new grunting.  I believe the grunting is most likely a new expression of displeasure.  The patient did grunt while I was listening to lung sounds leading me to believe that he did not appreciate that encounter.  He did not grunt otherwise during the entire encounter and this was noted and verbalized by his sister.  There are no exam findings nor other new habits that would make me believe this is related to pain. -Annual blood work ordered. Will call  with results.   Nose dryness Likely secondary to almost daily Flonase  use. Patient rubs nose frequently and mother has commented on how dry the nares appear. No epistaxis reported.  -Discontinue Flonase . Use normal saline as needed to rinse the nose instead.  -Vaseline to inner nares as needed for dryness throughout the day.    Camie Dixons, DO [facility]

## 2024-01-24 NOTE — Patient Instructions (Signed)
 It was wonderful to see you today.  Please bring ALL of your medications with you to every visit.   Today we talked about:  Your annual physical.  The blood work you have requested has been ordered.  I will call with the results.  Continue to eat whole foods and exercise at least 30 minutes a day.  Follow-up as needed.  Thank you for choosing [facility].   Please call [phone] with any questions about today's appointment.  Please arrive at least 15 minutes prior to your scheduled appointments.   If you had blood work today, I will send you a MyChart message or a letter if results are normal. Otherwise, I will give you a call.   If you had a referral placed, they will call you to set up an appointment. Please give us  a call if you don't hear back in the next 2 weeks.   If you need additional refills before your next appointment, please call your pharmacy first.   You should follow up in our clinic as needed.  Camie Dixons, DO Family Medicine

## 2024-01-28 ENCOUNTER — Other Ambulatory Visit: Payer: MEDICAID

## 2024-01-31 NOTE — Addendum Note (Signed)
 Addended byBETHA LUPIE CREDIT on: 01/31/2024 01:48 PM   Modules accepted: Level of Service

## 2024-02-03 ENCOUNTER — Other Ambulatory Visit: Payer: Self-pay

## 2024-02-03 MED ORDER — TRETINOIN 0.025 % EX CREA: TOPICAL_CREAM | Freq: Every day | CUTANEOUS | 3 refills | Status: AC

## 2024-02-10 ENCOUNTER — Other Ambulatory Visit: Payer: Self-pay | Admitting: Family Medicine

## 2024-02-10 DIAGNOSIS — R12 Heartburn: Secondary | ICD-10-CM

## 2024-02-12 ENCOUNTER — Other Ambulatory Visit: Payer: Self-pay | Admitting: Family Medicine

## 2024-02-12 DIAGNOSIS — G4700 Insomnia, unspecified: Secondary | ICD-10-CM

## 2024-02-18 ENCOUNTER — Other Ambulatory Visit: Payer: MEDICAID

## 2024-02-25 ENCOUNTER — Other Ambulatory Visit: Payer: MEDICAID

## 2024-03-03 ENCOUNTER — Other Ambulatory Visit (INDEPENDENT_AMBULATORY_CARE_PROVIDER_SITE_OTHER): Payer: MEDICAID

## 2024-03-03 DIAGNOSIS — Z0000 Encounter for general adult medical examination without abnormal findings: Secondary | ICD-10-CM | POA: Diagnosis not present

## 2024-03-03 LAB — POCT GLYCOSYLATED HEMOGLOBIN (HGB A1C): Hemoglobin A1C: 4.5 % (ref 4.0–5.6)

## 2024-03-04 ENCOUNTER — Ambulatory Visit: Payer: Self-pay

## 2024-03-04 LAB — CBC WITH DIFFERENTIAL/PLATELET
Basophils Absolute: 0.1 x10E3/uL (ref 0.0–0.2)
Basos: 2 %
EOS (ABSOLUTE): 0.1 x10E3/uL (ref 0.0–0.4)
Eos: 2 %
Hematocrit: 38 % (ref 37.5–51.0)
Hemoglobin: 13.1 g/dL (ref 13.0–17.7)
Immature Grans (Abs): 0 x10E3/uL (ref 0.0–0.1)
Immature Granulocytes: 0 %
Lymphocytes Absolute: 2.7 x10E3/uL (ref 0.7–3.1)
Lymphs: 38 %
MCH: 35.3 pg — ABNORMAL HIGH (ref 26.6–33.0)
MCHC: 34.5 g/dL (ref 31.5–35.7)
MCV: 102 fL — ABNORMAL HIGH (ref 79–97)
Monocytes Absolute: 0.5 x10E3/uL (ref 0.1–0.9)
Monocytes: 7 %
Neutrophils Absolute: 3.6 x10E3/uL (ref 1.4–7.0)
Neutrophils: 51 %
Platelets: 193 x10E3/uL (ref 150–450)
RBC: 3.71 x10E6/uL — ABNORMAL LOW (ref 4.14–5.80)
RDW: 11.4 % — ABNORMAL LOW (ref 11.6–15.4)
WBC: 7.1 x10E3/uL (ref 3.4–10.8)

## 2024-03-04 LAB — COMPREHENSIVE METABOLIC PANEL WITH GFR
ALT: 57 IU/L — ABNORMAL HIGH (ref 0–44)
AST: 36 IU/L (ref 0–40)
Albumin: 3.8 g/dL — ABNORMAL LOW (ref 4.3–5.2)
Alkaline Phosphatase: 132 IU/L — ABNORMAL HIGH (ref 44–121)
BUN/Creatinine Ratio: 18 (ref 9–20)
BUN: 14 mg/dL (ref 6–20)
Bilirubin Total: 0.7 mg/dL (ref 0.0–1.2)
CO2: 26 mmol/L (ref 20–29)
Calcium: 8.8 mg/dL (ref 8.7–10.2)
Chloride: 102 mmol/L (ref 96–106)
Creatinine, Ser: 0.79 mg/dL (ref 0.76–1.27)
Globulin, Total: 3.2 g/dL (ref 1.5–4.5)
Glucose: 89 mg/dL (ref 70–99)
Potassium: 4.3 mmol/L (ref 3.5–5.2)
Sodium: 139 mmol/L (ref 134–144)
Total Protein: 7 g/dL (ref 6.0–8.5)
eGFR: 126 mL/min/1.73 (ref >59)

## 2024-03-04 LAB — LIPID PANEL
Chol/HDL Ratio: 3.1 ratio (ref 0.0–5.0)
Cholesterol, Total: 122 mg/dL (ref 100–199)
HDL: 39 mg/dL — ABNORMAL LOW (ref >39)
LDL Chol Calc (NIH): 65 mg/dL (ref 0–99)
Triglycerides: 91 mg/dL (ref 0–149)
VLDL Cholesterol Cal: 18 mg/dL (ref 5–40)

## 2024-03-04 LAB — TSH: TSH: 0.958 u[IU]/mL (ref 0.450–4.500)

## 2024-03-15 ENCOUNTER — Other Ambulatory Visit: Payer: Self-pay | Admitting: Family Medicine

## 2024-03-15 DIAGNOSIS — G4700 Insomnia, unspecified: Secondary | ICD-10-CM

## 2024-03-26 ENCOUNTER — Other Ambulatory Visit: Payer: Self-pay | Admitting: Family Medicine

## 2024-03-26 DIAGNOSIS — R12 Heartburn: Secondary | ICD-10-CM

## 2024-04-11 ENCOUNTER — Other Ambulatory Visit: Payer: Self-pay | Admitting: Family Medicine

## 2024-04-11 DIAGNOSIS — G4700 Insomnia, unspecified: Secondary | ICD-10-CM

## 2024-04-29 ENCOUNTER — Other Ambulatory Visit: Payer: Self-pay | Admitting: Family Medicine

## 2024-04-29 DIAGNOSIS — R12 Heartburn: Secondary | ICD-10-CM

## 2024-05-09 ENCOUNTER — Other Ambulatory Visit: Payer: Self-pay | Admitting: Family Medicine

## 2024-05-09 DIAGNOSIS — G4700 Insomnia, unspecified: Secondary | ICD-10-CM

## 2024-05-28 ENCOUNTER — Other Ambulatory Visit: Payer: Self-pay | Admitting: Family Medicine

## 2024-05-28 DIAGNOSIS — R12 Heartburn: Secondary | ICD-10-CM

## 2024-06-05 ENCOUNTER — Other Ambulatory Visit: Payer: Self-pay | Admitting: Family Medicine

## 2024-06-05 DIAGNOSIS — G4700 Insomnia, unspecified: Secondary | ICD-10-CM

## 2024-07-05 ENCOUNTER — Other Ambulatory Visit: Payer: Self-pay | Admitting: Family Medicine

## 2024-07-05 DIAGNOSIS — R12 Heartburn: Secondary | ICD-10-CM

## 2024-07-05 DIAGNOSIS — G4700 Insomnia, unspecified: Secondary | ICD-10-CM
